# Patient Record
Sex: Female | Born: 1980 | Race: Black or African American | Hispanic: No | Smoking: Never smoker
Health system: Southern US, Community
[De-identification: ages and names within clinical notes are randomized; demographics above are authoritative.]

## PROBLEM LIST (undated history)

## (undated) ENCOUNTER — Inpatient Hospital Stay (HOSPITAL_COMMUNITY): Payer: Self-pay

## (undated) DIAGNOSIS — A749 Chlamydial infection, unspecified: Secondary | ICD-10-CM

## (undated) DIAGNOSIS — Z789 Other specified health status: Secondary | ICD-10-CM

## (undated) HISTORY — DX: Other specified health status: Z78.9

## (undated) HISTORY — PX: DILATION AND CURETTAGE OF UTERUS: SHX78

## (undated) HISTORY — PX: WISDOM TOOTH EXTRACTION: SHX21

---
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
## 2004-09-21 ENCOUNTER — Emergency Department (HOSPITAL_COMMUNITY): Admission: AD | Admit: 2004-09-21 | Discharge: 2004-09-21 | Payer: Self-pay | Admitting: Family Medicine

## 2005-05-27 ENCOUNTER — Emergency Department (HOSPITAL_COMMUNITY): Admission: EM | Admit: 2005-05-27 | Discharge: 2005-05-27 | Payer: Self-pay | Admitting: Emergency Medicine

## 2005-09-26 ENCOUNTER — Encounter: Admission: RE | Admit: 2005-09-26 | Discharge: 2005-09-26 | Payer: Self-pay | Admitting: Obstetrics and Gynecology

## 2007-02-20 ENCOUNTER — Encounter: Admission: RE | Admit: 2007-02-20 | Discharge: 2007-02-20 | Payer: Self-pay | Admitting: *Deleted

## 2007-03-20 ENCOUNTER — Emergency Department (HOSPITAL_COMMUNITY): Admission: EM | Admit: 2007-03-20 | Discharge: 2007-03-21 | Payer: Self-pay | Admitting: Emergency Medicine

## 2007-04-06 ENCOUNTER — Ambulatory Visit: Payer: Self-pay | Admitting: Family Medicine

## 2008-05-04 ENCOUNTER — Emergency Department (HOSPITAL_COMMUNITY): Admission: EM | Admit: 2008-05-04 | Discharge: 2008-05-04 | Payer: Self-pay | Admitting: Emergency Medicine

## 2009-01-17 ENCOUNTER — Ambulatory Visit: Payer: Self-pay | Admitting: Family Medicine

## 2009-01-19 ENCOUNTER — Ambulatory Visit: Payer: Self-pay | Admitting: Family Medicine

## 2009-01-24 ENCOUNTER — Ambulatory Visit: Payer: Self-pay | Admitting: Family Medicine

## 2009-04-22 IMAGING — CT CT ABDOMEN W/O CM
2 of 3 series · 17 of 46 positions shown, 19 images · non-contrast
Comparison: None.

ABDOMEN CT WITHOUT CONTRAST:

CLINICAL DATA: Low abdominal pain. Hematuria.
TECHNIQUE: Multidetector CT imaging of the abdomen and pelvis was performed
following the renal stone protocol without IV contrast.  Images were obtained
from the upper abdomen through the pubic symphysis.

[Series 2: renal stone w/o · axial · non-contrast · 0.54mm/px · z∈[-310,-25]mm · 14 of 67 slices shown, 16 images]
[im 5/67  soft-tissue]
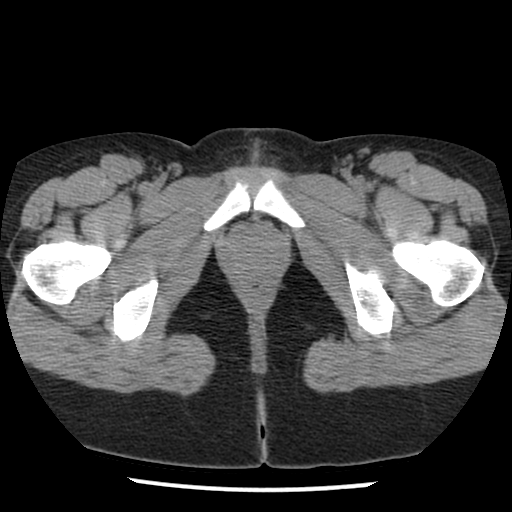
[im 5/67  bone]
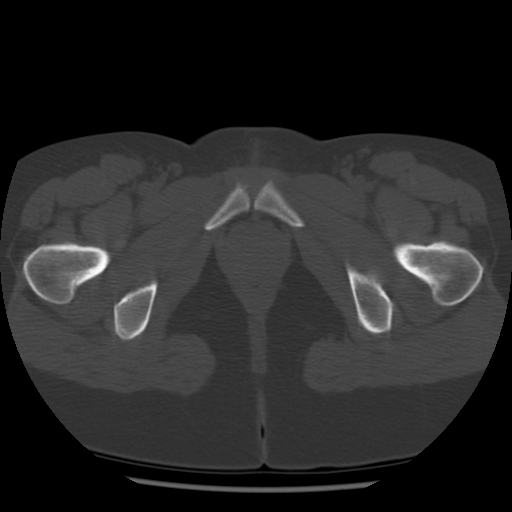
[im 9/67  soft-tissue]
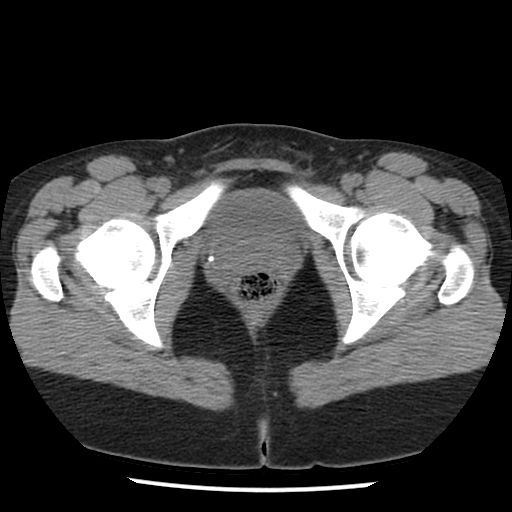
[im 13/67  soft-tissue]
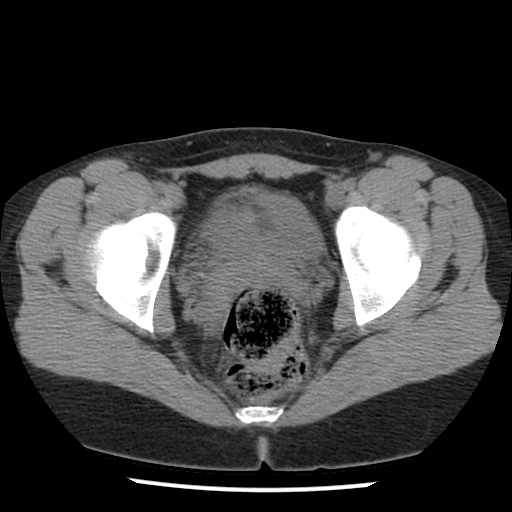
[im 18/67  soft-tissue]
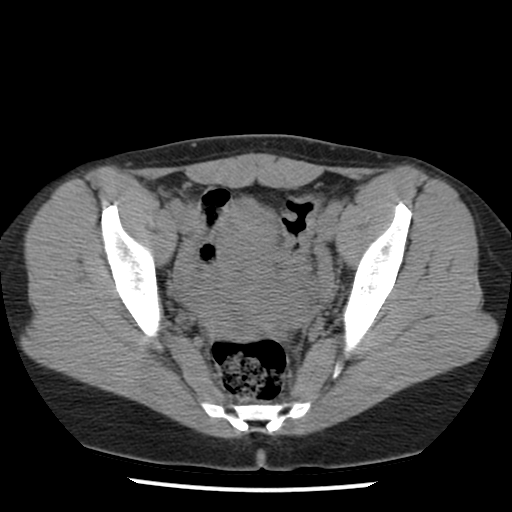
[im 22/67  soft-tissue]
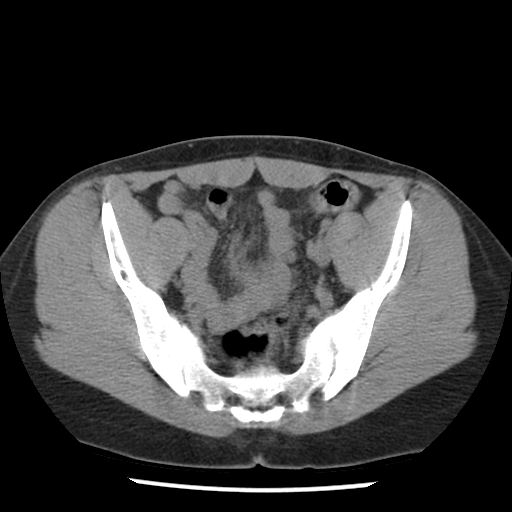
[im 26/67  soft-tissue]
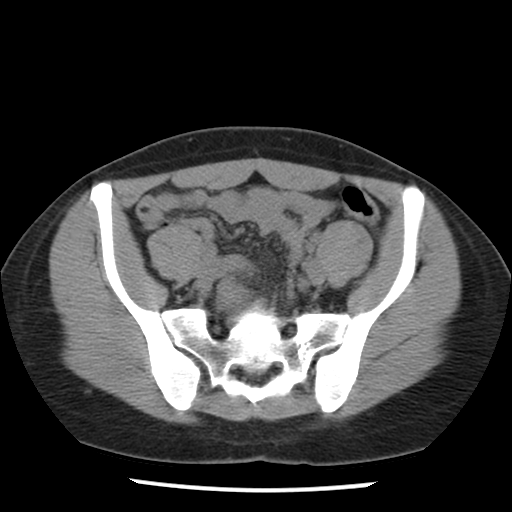
[im 30/67  soft-tissue]
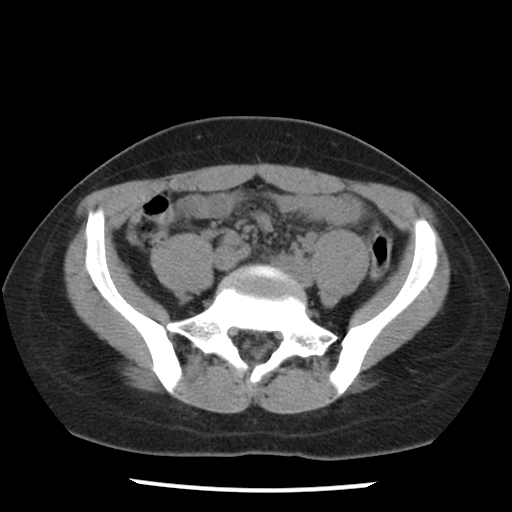
[im 37/67  soft-tissue]
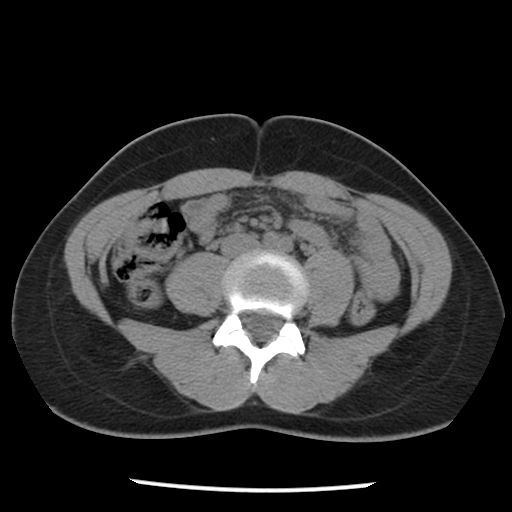
[im 41/67  soft-tissue]
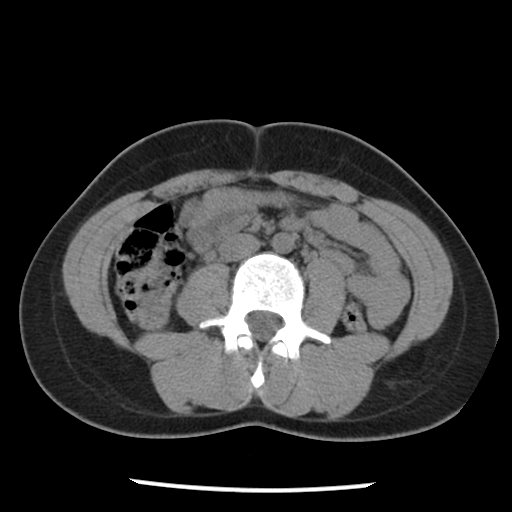
[im 41/67  bone]
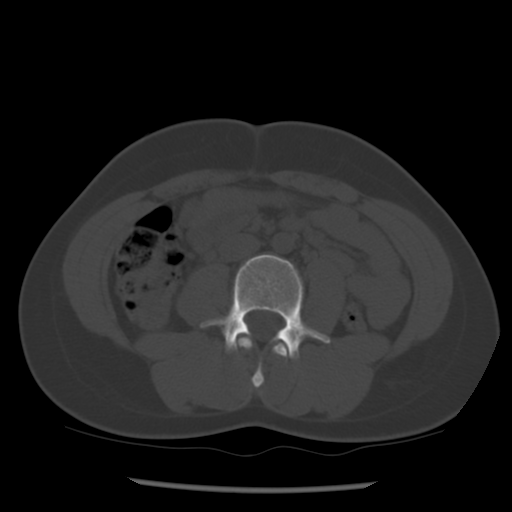
[im 45/67  soft-tissue]
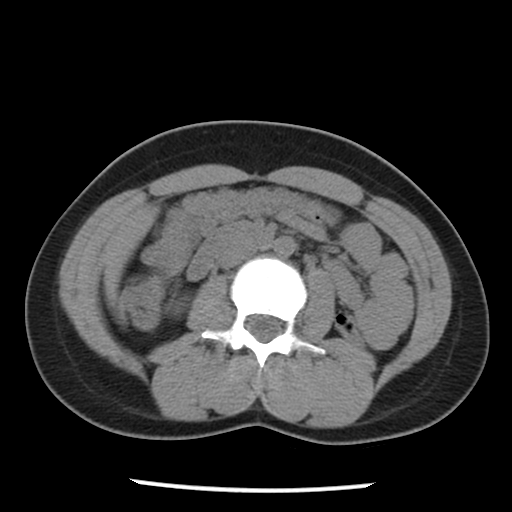
[im 49/67  soft-tissue]
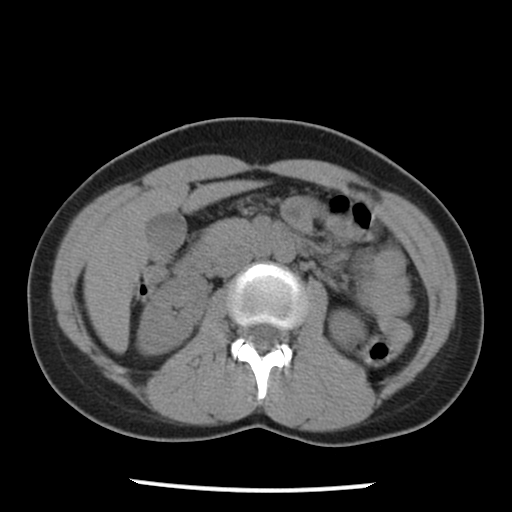
[im 54/67  soft-tissue]
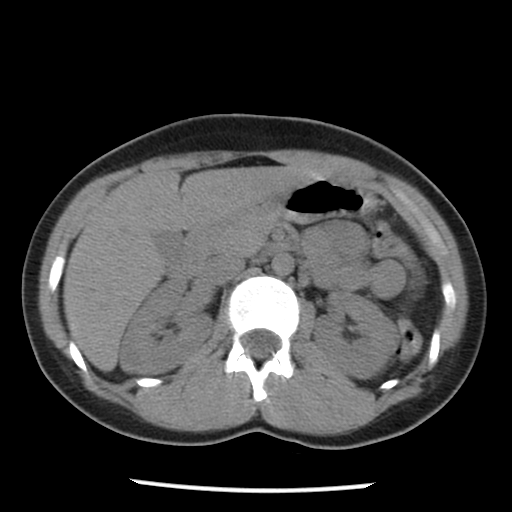
[im 58/67  soft-tissue]
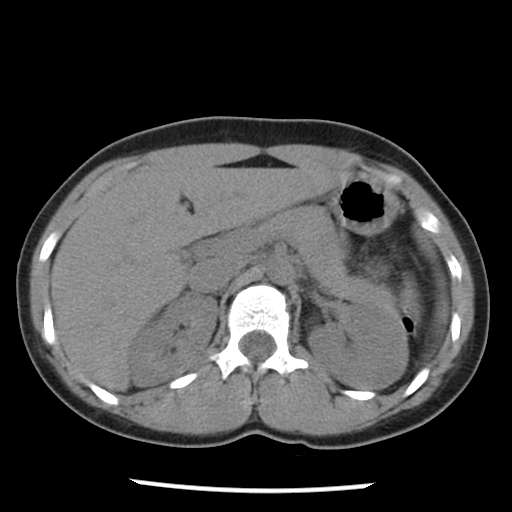
[im 62/67  soft-tissue]
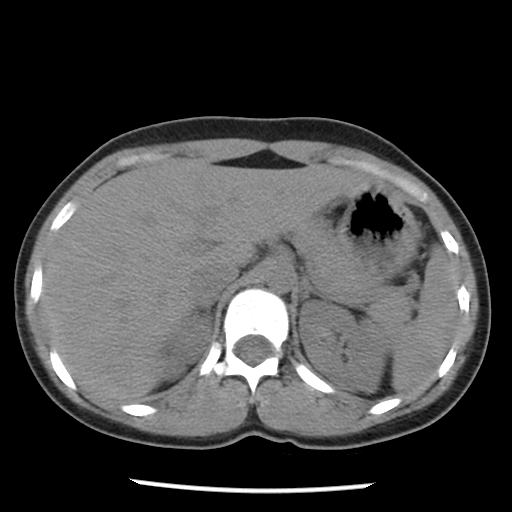

[Series 401: coronal · coronal · 0.70mm/px · 3 of 89 slices shown]
[im 30/89  soft-tissue]
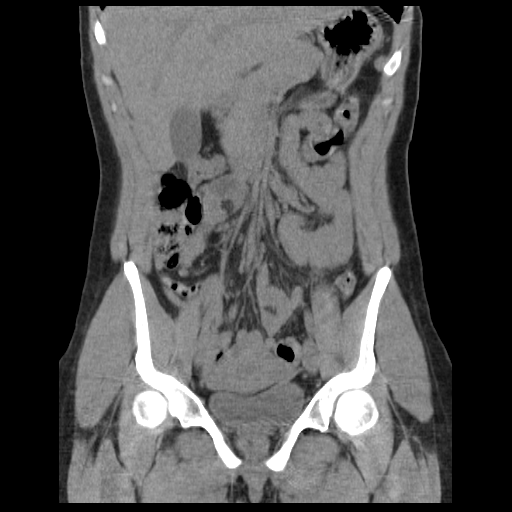
[im 40/89  soft-tissue]
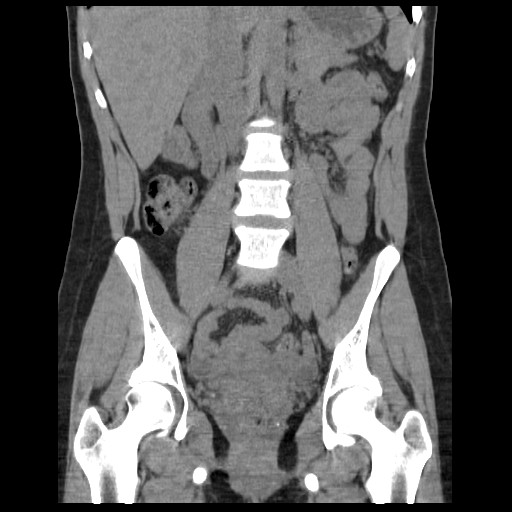
[im 49/89  soft-tissue]
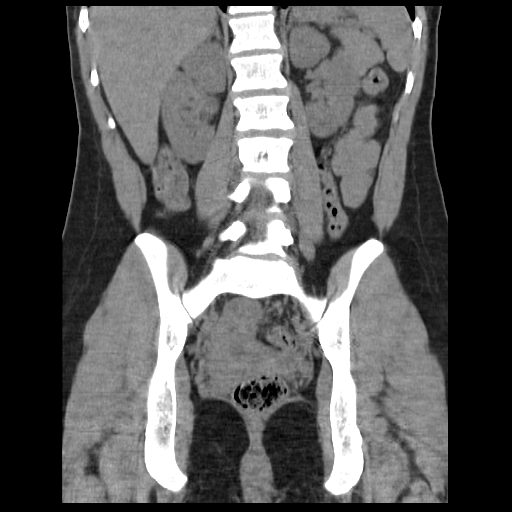

[17 of 46 positions shown; findings below may reference images not displayed]

FINDINGS: No focal abnormality is seen in the liver or spleen on this uninfused
exam. The stomach is decompressed. The duodenum, pancreas, gallbladder, and
adrenal glands are unremarkable.

There are no stones in either kidney. There is no hydronephrosis. No perinephric
edema or inflammation.

No intraperitoneal free fluid. Abdominal bowel loops are unremarkable. There is
no abdominal lymphadenopathy.
IMPRESSION: No renal stones.

No acute findings in the abdomen.

PELVIS CT WITHOUT CONTRAST:
FINDINGS: Multiple phleboliths are seen in the anatomic pelvis. There is no
evidence for ureteral or bladder stones.

There is a trace amount of intraperitoneal free fluid. No evidence for an
adnexal mass. The terminal ileum and the appendix are normal.
IMPRESSION: No distal urinary stones.

No acute findings in the anatomic pelvis.

## 2009-06-19 ENCOUNTER — Ambulatory Visit: Payer: Self-pay | Admitting: Family Medicine

## 2009-07-23 ENCOUNTER — Ambulatory Visit: Payer: Self-pay | Admitting: Family Medicine

## 2009-11-02 ENCOUNTER — Emergency Department (HOSPITAL_COMMUNITY): Admission: EM | Admit: 2009-11-02 | Discharge: 2009-11-02 | Payer: Self-pay | Admitting: Emergency Medicine

## 2009-11-17 ENCOUNTER — Encounter: Admission: RE | Admit: 2009-11-17 | Discharge: 2009-11-17 | Payer: Self-pay | Admitting: Obstetrics and Gynecology

## 2010-06-10 ENCOUNTER — Emergency Department (HOSPITAL_COMMUNITY): Admission: EM | Admit: 2010-06-10 | Discharge: 2010-06-10 | Payer: Self-pay | Admitting: Emergency Medicine

## 2010-10-08 LAB — CBC
Hemoglobin: 12.7 g/dL (ref 12.0–15.0)
MCHC: 34.3 g/dL (ref 30.0–36.0)
MCV: 87.4 fL (ref 78.0–100.0)
RBC: 4.24 MIL/uL (ref 3.87–5.11)

## 2010-10-08 LAB — POCT CARDIAC MARKERS
CKMB, poc: <1 ng/mL — ABNORMAL LOW (ref 1.0–8.0)
Troponin i, poc: <0.05 ng/mL (ref 0.00–0.09)

## 2010-10-08 LAB — BASIC METABOLIC PANEL
CO2: 26 mEq/L (ref 19–32)
Chloride: 105 mEq/L (ref 96–112)
GFR calc Af Amer: >60 mL/min (ref >60)
Sodium: 140 mEq/L (ref 135–145)

## 2010-10-08 LAB — DIFFERENTIAL
Basophils Relative: 1 % (ref 0–1)
Eosinophils Absolute: 0.2 10*3/uL (ref 0.0–0.7)
Monocytes Absolute: 0.3 10*3/uL (ref 0.1–1.0)
Monocytes Relative: 6 % (ref 3–12)
Neutro Abs: 3 10*3/uL (ref 1.7–7.7)

## 2011-05-02 LAB — COMPREHENSIVE METABOLIC PANEL
ALT: 30
AST: 25
Albumin: 4.3
Calcium: 9.3
Creatinine, Ser: 0.69
GFR calc Af Amer: >60
Sodium: 138
Total Protein: 7.9

## 2011-05-02 LAB — CBC
MCHC: 34.4
Platelets: 257
RBC: 4.66
RDW: 12.1

## 2011-05-02 LAB — DIFFERENTIAL
Eosinophils Absolute: 0
Eosinophils Relative: 0
Lymphocytes Relative: 2 — ABNORMAL LOW
Lymphs Abs: 0.2 — ABNORMAL LOW
Monocytes Relative: 0 — ABNORMAL LOW

## 2011-05-02 LAB — PREGNANCY, URINE: Preg Test, Ur: NEGATIVE

## 2011-05-02 LAB — URINALYSIS, ROUTINE W REFLEX MICROSCOPIC
Bilirubin Urine: NEGATIVE
Nitrite: NEGATIVE
Protein, ur: NEGATIVE
Specific Gravity, Urine: 1.023
Urobilinogen, UA: 0.2

## 2011-11-12 DIAGNOSIS — J302 Other seasonal allergic rhinitis: Secondary | ICD-10-CM | POA: Insufficient documentation

## 2012-01-29 ENCOUNTER — Other Ambulatory Visit: Payer: Self-pay | Admitting: Radiology

## 2012-06-11 ENCOUNTER — Inpatient Hospital Stay (HOSPITAL_COMMUNITY): Payer: Medicaid Other

## 2012-06-11 ENCOUNTER — Encounter (HOSPITAL_COMMUNITY): Payer: Self-pay | Admitting: *Deleted

## 2012-06-11 ENCOUNTER — Inpatient Hospital Stay (HOSPITAL_COMMUNITY)
Admission: AD | Admit: 2012-06-11 | Discharge: 2012-06-11 | Disposition: A | Discharge status: Home / Self Care | Payer: Medicaid Other | Source: Ambulatory Visit | Attending: Obstetrics | Admitting: Obstetrics

## 2012-06-11 DIAGNOSIS — Z3490 Encounter for supervision of normal pregnancy, unspecified, unspecified trimester: Secondary | ICD-10-CM

## 2012-06-11 DIAGNOSIS — R1032 Left lower quadrant pain: Secondary | ICD-10-CM | POA: Insufficient documentation

## 2012-06-11 DIAGNOSIS — O34599 Maternal care for other abnormalities of gravid uterus, unspecified trimester: Secondary | ICD-10-CM | POA: Insufficient documentation

## 2012-06-11 DIAGNOSIS — N831 Corpus luteum cyst of ovary, unspecified side: Secondary | ICD-10-CM | POA: Diagnosis present

## 2012-06-11 HISTORY — DX: Other specified health status: Z78.9

## 2012-06-11 LAB — WET PREP, GENITAL: Yeast Wet Prep HPF POC: NONE SEEN

## 2012-06-11 LAB — CBC
HCT: 37.9 % (ref 36.0–46.0)
Hemoglobin: 12.8 g/dL (ref 12.0–15.0)
Platelets: 272 10*3/uL (ref 150–400)
RBC: 4.41 MIL/uL (ref 3.87–5.11)
RDW: 12.2 % (ref 11.5–15.5)

## 2012-06-11 LAB — URINALYSIS, ROUTINE W REFLEX MICROSCOPIC
Glucose, UA: NEGATIVE mg/dL
Hgb urine dipstick: NEGATIVE
Ketones, ur: NEGATIVE mg/dL
Protein, ur: NEGATIVE mg/dL
pH: 6.5 (ref 5.0–8.0)

## 2012-06-11 LAB — POCT PREGNANCY, URINE: Preg Test, Ur: POSITIVE — AB

## 2012-06-11 NOTE — MAU Note (Signed)
Pt discharged by error  ,

## 2012-06-11 NOTE — MAU Note (Signed)
Patient states she had a positive pregnancy test at [facility] on 11-13. Has first appointment with Dr. Gaynell Face on 12-2. State she started having sharp pain on the left side at 0800. Has nausea with vomiting iin the am only. Denies any bleeding or vaginal discharge.

## 2012-06-11 NOTE — MAU Note (Signed)
+  HPT & pos test @ [facility] on 10/12, started having LLQ pain today.  Pt denies bleeding.

## 2012-06-11 NOTE — MAU Provider Note (Signed)
Chief Complaint: Abdominal Pain   First Provider Initiated Contact with Patient 06/11/12 1636     SUBJECTIVE HPI: Gina Hayden is a 31 y.o. G2P0010 at [redacted]w[redacted]d by LMP who presents to maternity admissions reporting LLQ pain described as sharp, intermittent, and "where my ovaries are".  She had a positive pregnancy test at home and at [facility] and has had one visit for paperwork with Dr Gaynell Face.  She denies vaginal bleeding, vaginal itching/burning, urinary symptoms, h/a, dizziness, n/v, or fever/chills.     Past Medical History  Diagnosis Date  . No pertinent past medical history    Past Surgical History  Procedure Date  . Wisdom tooth extraction    History   Social History  . Marital Status: Single    Spouse Name: N/A    Number of Children: N/A  . Years of Education: N/A   Occupational History  . Not on file.   Social History Main Topics  . Smoking status: Never Smoker   . Smokeless tobacco: Not on file  . Alcohol Use: Yes     Comment: social alcohol before pregnancy  . Drug Use: No  . Sexually Active: Yes   Other Topics Concern  . Not on file   Social History Narrative  . No narrative on file   No current facility-administered medications on file prior to encounter.   No current outpatient prescriptions on file prior to encounter.   Allergies not on file  ROS: Pertinent items in HPI  OBJECTIVE Blood pressure 123/77, pulse 98, temperature 98.9 F (37.2 C), temperature source Oral, resp. rate 16, height 5\' 3"  (1.6 m), weight 56.155 kg (123 lb 12.8 oz), last menstrual period 04/30/2012, SpO2 100.00%. GENERAL: Well-developed, well-nourished female in no acute distress.  HEENT: Normocephalic HEART: normal rate RESP: normal effort ABDOMEN: Soft, non-tender EXTREMITIES: Nontender, no edema NEURO: Alert and oriented Pelvic exam: Cervix pink, visually closed, without lesion, scant white creamy discharge, vaginal walls and external genitalia normal Bimanual  exam: Cervix 0/long/high, firm, anterior, neg CMT, uterus nontender, nonenlarged, adnexa with mild tenderness on left, none on right, no enlargement or mass bilaterally   LAB RESULTS Results for orders placed during the hospital encounter of 06/11/12 (from the past 24 hour(s))  URINALYSIS, ROUTINE W REFLEX MICROSCOPIC     Status: Normal   Collection Time   06/11/12  1:00 PM      Component Value Range   Color, Urine YELLOW  YELLOW   APPearance CLEAR  CLEAR   Specific Gravity, Urine 1.020  1.005 - 1.030   pH 6.5  5.0 - 8.0   Glucose, UA NEGATIVE  NEGATIVE mg/dL   Hgb urine dipstick NEGATIVE  NEGATIVE   Bilirubin Urine NEGATIVE  NEGATIVE   Ketones, ur NEGATIVE  NEGATIVE mg/dL   Protein, ur NEGATIVE  NEGATIVE mg/dL   Urobilinogen, UA 0.2  0.0 - 1.0 mg/dL   Nitrite NEGATIVE  NEGATIVE   Leukocytes, UA NEGATIVE  NEGATIVE  POCT PREGNANCY, URINE     Status: Abnormal   Collection Time   06/11/12  3:11 PM      Component Value Range   Preg Test, Ur POSITIVE (*) NEGATIVE  CBC     Status: Normal   Collection Time   06/11/12  4:23 PM      Component Value Range   WBC 9.0  4.0 - 10.5 K/uL   RBC 4.41  3.87 - 5.11 MIL/uL   Hemoglobin 12.8  12.0 - 15.0 g/dL   HCT 37.9  36.0 - 46.0 %   MCV 85.9  78.0 - 100.0 fL   MCH 29.0  26.0 - 34.0 pg   MCHC 33.8  30.0 - 36.0 g/dL   RDW 40.9  81.1 - 91.4 %   Platelets 272  150 - 400 K/uL  ABO/RH     Status: Normal (Preliminary result)   Collection Time   06/11/12  4:23 PM      Component Value Range   ABO/RH(D) A POS    WET PREP, GENITAL     Status: Abnormal   Collection Time   06/11/12  4:35 PM      Component Value Range   Yeast Wet Prep HPF POC NONE SEEN  NONE SEEN   Trich, Wet Prep NONE SEEN  NONE SEEN   Clue Cells Wet Prep HPF POC FEW (*) NONE SEEN   WBC, Wet Prep HPF POC FEW (*) NONE SEEN    IMAGING   ASSESSMENT 1. Normal IUP (intrauterine pregnancy) on prenatal ultrasound   2. Corpus luteum cyst     PLAN Discharge home Reviewed  normal U/S findings with pt F/U with Dr Gaynell Face Return to MAU as needed     Medication List    Notice       You have not been prescribed any medications.            Follow-up Information    Follow up with Kathreen Cosier, MD.   Contact information:   [address] [place] [address] [phone]          Sharen Counter Certified Nurse-Midwife 06/11/2012  5:25 PM

## 2012-07-06 LAB — OB RESULTS CONSOLE ABO/RH

## 2012-07-06 LAB — OB RESULTS CONSOLE HEPATITIS B SURFACE ANTIGEN: Hepatitis B Surface Ag: NEGATIVE

## 2012-07-06 LAB — OB RESULTS CONSOLE ANTIBODY SCREEN: Antibody Screen: NEGATIVE

## 2012-07-06 LAB — OB RESULTS CONSOLE RUBELLA ANTIBODY, IGM: Rubella: IMMUNE

## 2012-07-21 NOTE — L&D Delivery Note (Signed)
Delivery Note At 11:03 PM a viable female was delivered via  (Presentation: direct OP ).  Nuchal cord x 1.   Placenta status: delivered w/cord traction, intact, 3 vessels.  Membranes malodorous.  Cord pH: 7.13  Decreased, no cry initially.  Heart rate  100 bpm.  Neonatology called. Infant resuscitated--PPV given by bag/mask.  Infant w/undescended testes per Neonatology.  Anesthesia: Epidural  Episiotomy: None Lacerations: 2nd degree Suture Repair: 2.0 3.0 vicryl rapide Est. Blood Loss (mL): 250 ml  Mom to postpartum.  Baby to nursery-stable.  JACKSON-MOORE,Jodelle Fausto A 01/16/2013, 11:27 PM

## 2012-08-21 ENCOUNTER — Encounter (HOSPITAL_COMMUNITY): Payer: Self-pay | Admitting: Obstetrics and Gynecology

## 2012-08-21 ENCOUNTER — Inpatient Hospital Stay (HOSPITAL_COMMUNITY)
Admission: AD | Admit: 2012-08-21 | Discharge: 2012-08-21 | Disposition: A | Discharge status: Home / Self Care | Payer: Medicaid Other | Source: Ambulatory Visit | Attending: Obstetrics | Admitting: Obstetrics

## 2012-08-21 DIAGNOSIS — M545 Low back pain, unspecified: Secondary | ICD-10-CM | POA: Insufficient documentation

## 2012-08-21 DIAGNOSIS — R109 Unspecified abdominal pain: Secondary | ICD-10-CM | POA: Insufficient documentation

## 2012-08-21 DIAGNOSIS — N949 Unspecified condition associated with female genital organs and menstrual cycle: Secondary | ICD-10-CM

## 2012-08-21 DIAGNOSIS — O99891 Other specified diseases and conditions complicating pregnancy: Secondary | ICD-10-CM | POA: Insufficient documentation

## 2012-08-21 DIAGNOSIS — R300 Dysuria: Secondary | ICD-10-CM | POA: Insufficient documentation

## 2012-08-21 LAB — URINE MICROSCOPIC-ADD ON

## 2012-08-21 LAB — URINALYSIS, ROUTINE W REFLEX MICROSCOPIC
Glucose, UA: NEGATIVE mg/dL
Ketones, ur: 15 mg/dL — AB
Protein, ur: NEGATIVE mg/dL

## 2012-08-21 LAB — WET PREP, GENITAL: Trich, Wet Prep: NONE SEEN

## 2012-08-21 NOTE — MAU Provider Note (Signed)
History     CSN: [id number]  Arrival date and time: 08/21/12 1520   First Provider Initiated Contact with Patient 08/21/12 [phone]      Chief Complaint  Patient presents with  . Back Pain  . Abdominal Pain  . Dysuria   HPI Gina Hayden is a 32 y.o. G2P0010 at [redacted]w[redacted]d who presents to MAU today complaining of lower abdominal pressure and low back pain. The patient has also had dysuria. She states that these symptoms have been going on for about 2 weeks. She denies vaginal bleeding, abnormal discharge and LOF, N/V or fever.   OB History    Grav Para Term Preterm Abortions TAB SAB Ect Mult Living   2    1 1           Past Medical History  Diagnosis Date  . No pertinent past medical history     Past Surgical History  Procedure Date  . Wisdom tooth extraction     Family History  Problem Relation Age of Onset  . Hypertension Mother   . Hyperlipidemia Mother   . Hypertension Sister     History  Substance Use Topics  . Smoking status: Never Smoker   . Smokeless tobacco: Not on file  . Alcohol Use: Yes     Comment: social alcohol before pregnancy    Allergies: No Known Allergies  Prescriptions prior to admission  Medication Sig Dispense Refill  . Prenatal Vit-Fe Fumarate-FA (PRENATAL MULTIVITAMIN) TABS Take 1 tablet by mouth daily.        ROS All negative unless otherwise noted in HPI Physical Exam   Blood pressure 120/78, pulse 99, temperature 98.3 F (36.8 C), resp. rate 16, height 5\' 2"  (1.575 m), weight 128 lb (58.06 kg), last menstrual period 04/30/2012.  Physical Exam  Constitutional: She is oriented to person, place, and time. She appears well-developed and well-nourished. No distress.  HENT:  Head: Normocephalic and atraumatic.  Cardiovascular: Normal rate, regular rhythm and normal heart sounds.   Respiratory: Effort normal and breath sounds normal. No respiratory distress.  GI: Soft. Bowel sounds are normal. She exhibits no distension and no mass.  There is tenderness (mild tenderness to palpation of the lower abdomen around the midline). There is no rebound and no guarding.  Genitourinary: Vagina normal. Cervix exhibits discharge (moderate amount of thick, white discharge noted at the cervix and in the vaginal vault). Cervix exhibits no motion tenderness and no friability. Right adnexum displays no mass and no tenderness. Left adnexum displays no mass and no tenderness.       Cervix closed, long and thick  Neurological: She is alert and oriented to person, place, and time.  Skin: Skin is warm and dry. No erythema.  Psychiatric: She has a normal mood and affect.   Results for orders placed during the hospital encounter of 08/21/12 (from the past 24 hour(s))  URINALYSIS, ROUTINE W REFLEX MICROSCOPIC     Status: Abnormal   Collection Time   08/21/12  3:30 PM      Component Value Range   Color, Urine YELLOW  YELLOW   APPearance CLEAR  CLEAR   Specific Gravity, Urine 1.025  1.005 - 1.030   pH 6.0  5.0 - 8.0   Glucose, UA NEGATIVE  NEGATIVE mg/dL   Hgb urine dipstick TRACE (*) NEGATIVE   Bilirubin Urine NEGATIVE  NEGATIVE   Ketones, ur 15 (*) NEGATIVE mg/dL   Protein, ur NEGATIVE  NEGATIVE mg/dL   Urobilinogen, UA 0.2  0.0 - 1.0 mg/dL   Nitrite NEGATIVE  NEGATIVE   Leukocytes, UA NEGATIVE  NEGATIVE  URINE MICROSCOPIC-ADD ON     Status: Abnormal   Collection Time   08/21/12  3:30 PM      Component Value Range   Squamous Epithelial / LPF FEW (*) RARE   WBC, UA 3-6  <3 WBC/hpf   RBC / HPF 0-2  <3 RBC/hpf   Bacteria, UA MANY (*) RARE  WET PREP, GENITAL     Status: Abnormal   Collection Time   08/21/12  4:00 PM      Component Value Range   Yeast Wet Prep HPF POC NONE SEEN  NONE SEEN   Trich, Wet Prep NONE SEEN  NONE SEEN   Clue Cells Wet Prep HPF POC NONE SEEN  NONE SEEN   WBC, Wet Prep HPF POC MODERATE (*) NONE SEEN    MAU Course  Procedures None  MDM Discussed patient with Dr. Clearance Coots. He feels that this is most likely round  ligament pain as we have ruled out any other reason for her discomfort. She can take tylenol as needed for pain.   Assessment and Plan  A: Round ligament pain  P: Discharge home Tylenol as needed for discomfort Keep scheduled follow-up with Dr. Gaynell Face Return to MAU as needed or if condition should change or worsen  Freddi Starr, PA-C 08/21/2012, 4:35 PM

## 2012-08-21 NOTE — MAU Note (Signed)
"  I have had lower abd pain and burning and pain with urination.  Its' been going on about 1-2 weeks.  At first I thought it was a yeast infection, so I used Monistat.  It is still going on after taking that.  I talked to the doctor and he said it might be a bladder infection.  No VB or abnormal vag d/c."

## 2012-08-21 NOTE — MAU Note (Signed)
Pt presents with complaints of abdominal pain, back pain and pain with urination. States she thought she had a yeast infection but the pain is getting worse and has been going on about 2 weeks.

## 2012-08-22 LAB — GC/CHLAMYDIA PROBE AMP
CT Probe RNA: NEGATIVE
GC Probe RNA: NEGATIVE

## 2012-08-22 LAB — URINE CULTURE: Colony Count: 85000

## 2012-10-19 ENCOUNTER — Inpatient Hospital Stay (HOSPITAL_COMMUNITY)
Admission: AD | Admit: 2012-10-19 | Discharge: 2012-10-19 | Disposition: A | Discharge status: Home / Self Care | Payer: Medicaid Other | Source: Ambulatory Visit | Attending: Obstetrics | Admitting: Obstetrics

## 2012-10-19 ENCOUNTER — Encounter (HOSPITAL_COMMUNITY): Payer: Self-pay

## 2012-10-19 DIAGNOSIS — R109 Unspecified abdominal pain: Secondary | ICD-10-CM | POA: Insufficient documentation

## 2012-10-19 DIAGNOSIS — M545 Low back pain, unspecified: Secondary | ICD-10-CM | POA: Insufficient documentation

## 2012-10-19 DIAGNOSIS — N949 Unspecified condition associated with female genital organs and menstrual cycle: Secondary | ICD-10-CM

## 2012-10-19 DIAGNOSIS — O99891 Other specified diseases and conditions complicating pregnancy: Secondary | ICD-10-CM | POA: Insufficient documentation

## 2012-10-19 LAB — URINALYSIS, ROUTINE W REFLEX MICROSCOPIC
Bilirubin Urine: NEGATIVE
Nitrite: NEGATIVE
Protein, ur: NEGATIVE mg/dL
Specific Gravity, Urine: 1.01 (ref 1.005–1.030)
Urobilinogen, UA: 0.2 mg/dL (ref 0.0–1.0)

## 2012-10-19 MED ORDER — ACETAMINOPHEN 500 MG PO TABS
1000.0000 mg | ORAL_TABLET | Freq: Once | ORAL | Status: AC
  Administered 2012-10-19: 1000 mg via ORAL
  Filled 2012-10-19: qty 2

## 2012-10-19 MED ORDER — ABDOMINAL BINDER/ELASTIC MED MISC: 1.0000 [IU] | Freq: Every day | Status: DC | PRN

## 2012-10-19 MED ORDER — CYCLOBENZAPRINE HCL 10 MG PO TABS: 10.0000 mg | ORAL_TABLET | Freq: Two times a day (BID) | ORAL | Status: DC | PRN

## 2012-10-19 NOTE — MAU Provider Note (Signed)
History     CSN: [id number]  Arrival date and time: 10/19/12 1105   First Provider Initiated Contact with Patient 10/19/12 1146      No chief complaint on file.  HPI [name] is a 32 y.o. G2P0010 at [redacted]w[redacted]d who presents to MAU today with complaint of mid abdominal pain bilaterally and low back pain. The patient describes the pain as sharp and mild cramping. This started around midnight. She has not taken anything for the pain. She denies N/V, fever, vaginal discharge, vaginal bleeding or LOF. She denies UTI symptoms or contractions. She reports good fetal movement. Her next appointment with Dr. Gaynell Face is on 11/03/12.    OB History   Grav Para Term Preterm Abortions TAB SAB Ect Mult Living   2    1 1           Past Medical History  Diagnosis Date  . No pertinent past medical history     Past Surgical History  Procedure Laterality Date  . Wisdom tooth extraction    . Dilation and curettage of uterus      abortion    Family History  Problem Relation Age of Onset  . Hypertension Mother   . Hyperlipidemia Mother   . Hypertension Sister     History  Substance Use Topics  . Smoking status: Never Smoker   . Smokeless tobacco: Not on file  . Alcohol Use: Yes     Comment: social alcohol before pregnancy    Allergies: No Known Allergies  No prescriptions prior to admission    Review of Systems  Constitutional: Negative for fever and malaise/fatigue.  Gastrointestinal: Positive for abdominal pain. Negative for nausea, vomiting, diarrhea and constipation.  Genitourinary: Negative for dysuria, urgency and frequency.       Neg - vaginal bleeding, discharge, LOF  Musculoskeletal: Positive for back pain.   Physical Exam   Blood pressure 121/65, pulse 105, temperature 98.8 F (37.1 C), temperature source Oral, resp. rate 18, height 5\' 1"  (1.549 m), weight 141 lb 8 oz (64.184 kg), last menstrual period 04/30/2012, SpO2 98.00%.  Physical Exam  Constitutional: She  is oriented to person, place, and time. She appears well-developed and well-nourished. No distress.  HENT:  Head: Normocephalic and atraumatic.  Cardiovascular: Normal rate, regular rhythm and normal heart sounds.   Respiratory: Effort normal and breath sounds normal. No respiratory distress.  GI: Soft. Bowel sounds are normal. She exhibits no distension and no mass. There is no tenderness (no tenderness to palpation). There is no rebound and no guarding.  Musculoskeletal: She exhibits no edema and no tenderness (no tenderness to palpation of the lower back).  Neurological: She is alert and oriented to person, place, and time.  Skin: Skin is warm and dry. No erythema.   Results for orders placed during the hospital encounter of 10/19/12 (from the past 24 hour(s))  URINALYSIS, ROUTINE W REFLEX MICROSCOPIC     Status: None   Collection Time    10/19/12 11:19 AM      Result Value Range   Color, Urine YELLOW  YELLOW   APPearance CLEAR  CLEAR   Specific Gravity, Urine 1.010  1.005 - 1.030   pH 7.0  5.0 - 8.0   Glucose, UA NEGATIVE  NEGATIVE mg/dL   Hgb urine dipstick NEGATIVE  NEGATIVE   Bilirubin Urine NEGATIVE  NEGATIVE   Ketones, ur NEGATIVE  NEGATIVE mg/dL   Protein, ur NEGATIVE  NEGATIVE mg/dL   Urobilinogen, UA 0.2  0.0 - 1.0 mg/dL   Nitrite NEGATIVE  NEGATIVE   Leukocytes, UA NEGATIVE  NEGATIVE   Fetal Monitoring: Baseline: 130 bpm, moderate variability, + accelerations, no decelerations Contractions: none  MAU Course  Procedures None  MDM Tylenol in MAU - pain improved from 7/10 - 3/10  Assessment and Plan  A: Round ligament pain Back pain in pregnancy  P: Discharge home Rx for Flexeril and abdominal binder sent to patient's pharmacy Tyelnol PRN for pain Keep scheduled follow-up with Dr. Gaynell Face Return to MAU as needed or if her condition should change or worsen  Freddi Starr, PA-C  10/19/2012, 3:48 PM

## 2012-10-19 NOTE — MAU Note (Signed)
Patient is in with c/o lower back and abdominal pain that started last night. She denies vaginal bleeding, LOF or abnormal vaginal discharge. She reports good fetal movement. She states that she have an appt on 04/16.

## 2013-01-15 ENCOUNTER — Encounter (HOSPITAL_COMMUNITY): Payer: Self-pay | Admitting: *Deleted

## 2013-01-15 ENCOUNTER — Inpatient Hospital Stay (HOSPITAL_COMMUNITY): Payer: Medicaid Other | Admitting: Anesthesiology

## 2013-01-15 ENCOUNTER — Inpatient Hospital Stay (HOSPITAL_COMMUNITY)
Admission: AD | Admit: 2013-01-15 | Discharge: 2013-01-18 | DRG: 775 | Disposition: A | Discharge status: Home / Self Care | Payer: Medicaid Other | Source: Ambulatory Visit | Attending: Obstetrics | Admitting: Obstetrics

## 2013-01-15 ENCOUNTER — Encounter (HOSPITAL_COMMUNITY): Payer: Self-pay | Admitting: Anesthesiology

## 2013-01-15 DIAGNOSIS — O4290 Premature rupture of membranes, unspecified as to length of time between rupture and onset of labor, unspecified weeks of gestation: Secondary | ICD-10-CM | POA: Diagnosis present

## 2013-01-15 LAB — CBC
Hemoglobin: 12.4 g/dL (ref 12.0–15.0)
MCHC: 33.7 g/dL (ref 30.0–36.0)
RDW: 13.1 % (ref 11.5–15.5)
WBC: 8.6 10*3/uL (ref 4.0–10.5)

## 2013-01-15 LAB — GROUP B STREP BY PCR: Group B strep by PCR: NEGATIVE

## 2013-01-15 MED ORDER — TERBUTALINE SULFATE 1 MG/ML IJ SOLN: 0.2500 mg | Freq: Once | INTRAMUSCULAR | Status: AC | PRN

## 2013-01-15 MED ORDER — LIDOCAINE HCL (PF) 1 % IJ SOLN
30.0000 mL | INTRAMUSCULAR | Status: DC | PRN
  Administered 2013-01-16: 30 mL via SUBCUTANEOUS
  Filled 2013-01-15 (×3): qty 30

## 2013-01-15 MED ORDER — FENTANYL 2.5 MCG/ML BUPIVACAINE 1/10 % EPIDURAL INFUSION (WH - ANES)
14.0000 mL/h | INTRAMUSCULAR | Status: DC | PRN
  Administered 2013-01-16 (×2): 14 mL/h via EPIDURAL
  Filled 2013-01-15 (×3): qty 125

## 2013-01-15 MED ORDER — LACTATED RINGERS IV SOLN
500.0000 mL | INTRAVENOUS | Status: DC | PRN
  Administered 2013-01-16: 1000 mL via INTRAVENOUS
  Administered 2013-01-16: 500 mL via INTRAVENOUS

## 2013-01-15 MED ORDER — LACTATED RINGERS IV SOLN
500.0000 mL | Freq: Once | INTRAVENOUS | Status: AC
  Administered 2013-01-15: 500 mL via INTRAVENOUS

## 2013-01-15 MED ORDER — CITRIC ACID-SODIUM CITRATE 334-500 MG/5ML PO SOLN: 30.0000 mL | ORAL | Status: DC | PRN

## 2013-01-15 MED ORDER — LACTATED RINGERS IV SOLN
INTRAVENOUS | Status: DC
  Administered 2013-01-15 – 2013-01-16 (×2): via INTRAVENOUS
  Administered 2013-01-16: 500 mL via INTRAVENOUS
  Administered 2013-01-16: 07:00:00 via INTRAVENOUS
  Administered 2013-01-16: 125 mL/h via INTRAVENOUS

## 2013-01-15 MED ORDER — OXYTOCIN 40 UNITS IN LACTATED RINGERS INFUSION - SIMPLE MED: 62.5000 mL/h | INTRAVENOUS | Status: DC

## 2013-01-15 MED ORDER — LIDOCAINE HCL (PF) 1 % IJ SOLN
INTRAMUSCULAR | Status: DC | PRN
  Administered 2013-01-15 (×2): 3 mL

## 2013-01-15 MED ORDER — PHENYLEPHRINE 40 MCG/ML (10ML) SYRINGE FOR IV PUSH (FOR BLOOD PRESSURE SUPPORT)
80.0000 ug | PREFILLED_SYRINGE | INTRAVENOUS | Status: DC | PRN
  Filled 2013-01-15: qty 2
  Filled 2013-01-15: qty 5

## 2013-01-15 MED ORDER — PHENYLEPHRINE 40 MCG/ML (10ML) SYRINGE FOR IV PUSH (FOR BLOOD PRESSURE SUPPORT)
80.0000 ug | PREFILLED_SYRINGE | INTRAVENOUS | Status: DC | PRN
  Filled 2013-01-15: qty 2

## 2013-01-15 MED ORDER — ACETAMINOPHEN 325 MG PO TABS: 650.0000 mg | ORAL_TABLET | ORAL | Status: DC | PRN

## 2013-01-15 MED ORDER — EPHEDRINE 5 MG/ML INJ
10.0000 mg | INTRAVENOUS | Status: DC | PRN
  Filled 2013-01-15: qty 4
  Filled 2013-01-15: qty 2

## 2013-01-15 MED ORDER — IBUPROFEN 600 MG PO TABS
600.0000 mg | ORAL_TABLET | Freq: Four times a day (QID) | ORAL | Status: DC | PRN
  Administered 2013-01-16: 600 mg via ORAL
  Filled 2013-01-15: qty 1

## 2013-01-15 MED ORDER — FENTANYL 2.5 MCG/ML BUPIVACAINE 1/10 % EPIDURAL INFUSION (WH - ANES)
INTRAMUSCULAR | Status: DC | PRN
  Administered 2013-01-15: 12 mL/h via EPIDURAL

## 2013-01-15 MED ORDER — OXYTOCIN 40 UNITS IN LACTATED RINGERS INFUSION - SIMPLE MED
1.0000 m[IU]/min | INTRAVENOUS | Status: DC
  Administered 2013-01-15: 2 m[IU]/min via INTRAVENOUS
  Administered 2013-01-16: 6 m[IU]/min via INTRAVENOUS
  Administered 2013-01-16: 3 m[IU]/min via INTRAVENOUS
  Administered 2013-01-16: 1 m[IU]/min via INTRAVENOUS
  Administered 2013-01-16: 5 m[IU]/min via INTRAVENOUS
  Administered 2013-01-16: 7 m[IU]/min via INTRAVENOUS
  Administered 2013-01-16: 4 m[IU]/min via INTRAVENOUS
  Administered 2013-01-16: 2 m[IU]/min via INTRAVENOUS
  Filled 2013-01-15: qty 1000

## 2013-01-15 MED ORDER — FLEET ENEMA 7-19 GM/118ML RE ENEM: 1.0000 | ENEMA | RECTAL | Status: DC | PRN

## 2013-01-15 MED ORDER — OXYCODONE-ACETAMINOPHEN 5-325 MG PO TABS: 1.0000 | ORAL_TABLET | ORAL | Status: DC | PRN

## 2013-01-15 MED ORDER — EPHEDRINE 5 MG/ML INJ
10.0000 mg | INTRAVENOUS | Status: DC | PRN
  Filled 2013-01-15: qty 2

## 2013-01-15 MED ORDER — ONDANSETRON HCL 4 MG/2ML IJ SOLN
4.0000 mg | Freq: Four times a day (QID) | INTRAMUSCULAR | Status: DC | PRN
  Administered 2013-01-16: 4 mg via INTRAVENOUS
  Filled 2013-01-15: qty 2

## 2013-01-15 MED ORDER — DIPHENHYDRAMINE HCL 50 MG/ML IJ SOLN: 12.5000 mg | INTRAMUSCULAR | Status: DC | PRN

## 2013-01-15 MED ORDER — OXYTOCIN BOLUS FROM INFUSION: 500.0000 mL | INTRAVENOUS | Status: DC

## 2013-01-15 NOTE — MAU Note (Signed)
PT  SAYS   SROM AT 635PM-   CLEAR FLUID.    AT OFFICE- VE - CLOSED.    DENIES HSV ND MRSA.    NO UC'S.

## 2013-01-15 NOTE — MAU Note (Signed)
Patient presents to MAU with c/o PROM clear fluid at 1830. Reports +FM; denies vaginal bleeding or pain.

## 2013-01-15 NOTE — Anesthesia Procedure Notes (Signed)
Epidural Patient location during procedure: OB Start time: 01/15/2013 10:25 AM End time: 01/15/2013 10:40 AM  Staffing Anesthesiologist: Lewie Loron R Performed by: anesthesiologist   Preanesthetic Checklist Completed: patient identified, pre-op evaluation, timeout performed, IV checked, risks and benefits discussed and monitors and equipment checked  Epidural Patient position: sitting Prep: site prepped and draped and DuraPrep Patient monitoring: heart rate, continuous pulse ox and blood pressure Approach: midline Injection technique: LOR air and LOR saline  Needle:  Needle type: Tuohy  Needle gauge: 17 G Needle length: 9 cm Needle insertion depth: 5 cm Catheter type: closed end flexible Catheter size: 19 Gauge Catheter at skin depth: 11 cm  Assessment Sensory level: T8 Events: blood not aspirated, injection not painful, no injection resistance, negative IV test and no paresthesia  Additional Notes Reason for block:procedure for pain

## 2013-01-15 NOTE — Anesthesia Preprocedure Evaluation (Signed)
Anesthesia Evaluation  Patient identified by MRN, date of birth, ID band Patient awake    Reviewed: Allergy & Precautions, H&P , NPO status , Patient's Chart, lab work & pertinent test results  Airway Mallampati: II TM Distance: >3 FB     Dental  (+) Dental Advisory Given and Teeth Intact   Pulmonary neg pulmonary ROS,  breath sounds clear to auscultation        Cardiovascular negative cardio ROS  Rhythm:Regular Rate:Normal     Neuro/Psych negative neurological ROS  negative psych ROS   GI/Hepatic negative GI ROS, Neg liver ROS,   Endo/Other  negative endocrine ROS  Renal/GU negative Renal ROS     Musculoskeletal negative musculoskeletal ROS (+)   Abdominal   Peds  Hematology negative hematology ROS (+)   Anesthesia Other Findings   Reproductive/Obstetrics (+) Pregnancy                           Anesthesia Physical Anesthesia Plan  ASA: II  Anesthesia Plan: Epidural   Post-op Pain Management:    Induction:   Airway Management Planned:   Additional Equipment:   Intra-op Plan:   Post-operative Plan:   Informed Consent: I have reviewed the patients History and Physical, chart, labs and discussed the procedure including the risks, benefits and alternatives for the proposed anesthesia with the patient or authorized representative who has indicated his/her understanding and acceptance.     Plan Discussed with:   Anesthesia Plan Comments:         Anesthesia Quick Evaluation

## 2013-01-16 DIAGNOSIS — O4290 Premature rupture of membranes, unspecified as to length of time between rupture and onset of labor, unspecified weeks of gestation: Secondary | ICD-10-CM | POA: Diagnosis present

## 2013-01-16 LAB — RPR: RPR Ser Ql: NONREACTIVE

## 2013-01-16 MED ORDER — LACTATED RINGERS IV SOLN: INTRAVENOUS | Status: DC

## 2013-01-16 MED ORDER — LACTATED RINGERS IV SOLN
INTRAVENOUS | Status: DC
  Administered 2013-01-16: 300 mL via INTRAUTERINE

## 2013-01-16 NOTE — Progress Notes (Signed)
Dr. Jean Rosenthal Christell Constant made aware of 6 minute decel and minimal variability of FHR and continued variables and early's. Advised that Pitocin now off, IV fluid bolus going. Orders recv'd to leave pitocin off at present until baby recovers.

## 2013-01-16 NOTE — Consult Note (Signed)
Neonatology Note:  Attendance at Code Apgar:   Our team responded to a Code Apgar call to room # 168 following NSVD, due to infant with apnea. The requesting physician was Dr. Tamela Oddi. The mother is a G2P0A1 A pos, GBS neg with an uncomplicated pregnancy. ROM occurred 25  hours PTD and the fluid was clear. There was no fetal distress prior to delivery other than variable decelerations with contractions. The mother had received no narcotics nor was she on magnesium sulfate. At delivery, the baby was OP. The baby was apneic. The OB nursing staff in attendance gave vigorous stimulation and a Code Apgar was called. Our team arrived at 3 minutes of life, at which time the baby was pale, flaccid, glassy-eyed and staring, with HR about 100, with little resp effort. We bulb suctioned and gave vigorous stimulation, but he remained apneic and the HR dropped to about 60-70, so PPV was applied for about 1 min, with good response. The baby's color and HR normalized very quickly, and he began to breathe regularly. The baby cried out loud at about 4-5 minutes of life. Tone was still decreased, but had normalized by 10 min of life. The baby remained pink and well-perfused. Ap (1-min Apgar score not yet assigned by OB nursing staff)/8/9 at 1, 5, and 10 min. Maternal temp just after delivery was 102.9 degrees. PE of baby was remarkable for female-appearing genitalia, but neither testicle is palpable. The penis is well-developed and there is no hypospadias, and the scrotum is rugated and hyperpigmented. I spoke with the parents in the DR, letting them know that an ultrasound to look for testes might be necessary tomorrow morning. I let them know that, although the baby appears female and probably is, that, in the absence of a palpable testicle, I cannot be sure that the baby is female. I called and spoke with Dr. Jenne Pane, who is on call for Dr. Excell Seltzer, to inform her of all the above, then transferred the baby to the Pediatrician's  care.   Doretha Sou, MD

## 2013-01-16 NOTE — Progress Notes (Signed)
Spoke with Dr. Tamela Oddi, allow patient to rest for 30 minutes then resume pushing.

## 2013-01-16 NOTE — H&P (Signed)
Gina Hayden is a 32 y.o. female presenting for rupture of membranes. Maternal Medical History:  Reason for admission: Rupture of membranes.   Fetal activity: Perceived fetal activity is normal.    Prenatal complications: no prenatal complications Prenatal Complications - Diabetes: none.    OB History   Grav Para Term Preterm Abortions TAB SAB Ect Mult Living   2    1 1          Past Medical History  Diagnosis Date  . No pertinent past medical history    Past Surgical History  Procedure Laterality Date  . Wisdom tooth extraction    . Dilation and curettage of uterus      abortion   Family History: family history includes Hyperlipidemia in her mother and Hypertension in her mother and sister. Social History:  reports that she has never smoked. She does not have any smokeless tobacco history on file. She reports that  drinks alcohol. She reports that she does not use illicit drugs.       Review of Systems  Constitutional: Negative for fever.  Eyes: Negative for blurred vision.  Respiratory: Negative for shortness of breath.   Gastrointestinal: Negative for vomiting.  Skin: Negative for rash.  Neurological: Negative for headaches.    Dilation: 3 Effacement (%): 70 Station: -2 Exam by:: Bertram Millard, RN Blood pressure 124/79, pulse 91, temperature 98.7 F (37.1 C), temperature source Oral, resp. rate 18, height 5\' 2"  (1.575 m), weight 165 lb 6 oz (75.014 kg), last menstrual period 04/30/2012, SpO2 98.00%. Maternal Exam:  Abdomen: not evaluated.  Introitus: not evaluated.   Cervix: not evaluated.   Fetal Exam Fetal Monitor Review: Variability: moderate (6-25 bpm).   Pattern: accelerations present and no decelerations.    Fetal State Assessment: Category I - tracings are normal.     Physical Exam  Constitutional: She appears well-developed.  HENT:  Head: Normocephalic.  Neck: Neck supple. No thyromegaly present.  Cardiovascular: Normal rate and regular  rhythm.   Respiratory: Breath sounds normal.  GI: Soft. Bowel sounds are normal.  Skin: No rash noted.    Prenatal labs: ABO, Rh: A/Positive/-- (12/17 0000) Antibody: Negative (12/17 0000) Rubella: Immune (12/17 0000) RPR: NON REACTIVE (06/28 2135)  HBsAg: Negative (12/17 0000)  HIV: Non-reactive (12/17 0000)  GBS: Negative (06/29 0000)   Assessment/Plan: [place]ipara @ [redacted]w[redacted]d.  PROM.  GBS negative. Category I FHT  Admit Low dose Pitocin per protocol Monitor for signs/symptoms of infection   JACKSON-MOORE,Promiss Labarbera A 01/16/2013, 8:01 AM

## 2013-01-17 ENCOUNTER — Encounter (HOSPITAL_COMMUNITY): Payer: Self-pay | Admitting: *Deleted

## 2013-01-17 MED ORDER — DIBUCAINE 1 % RE OINT: 1.0000 "application " | TOPICAL_OINTMENT | RECTAL | Status: DC | PRN

## 2013-01-17 MED ORDER — LANOLIN HYDROUS EX OINT: TOPICAL_OINTMENT | CUTANEOUS | Status: DC | PRN

## 2013-01-17 MED ORDER — WITCH HAZEL-GLYCERIN EX PADS: 1.0000 "application " | MEDICATED_PAD | CUTANEOUS | Status: DC | PRN

## 2013-01-17 MED ORDER — ZOLPIDEM TARTRATE 5 MG PO TABS: 5.0000 mg | ORAL_TABLET | Freq: Every evening | ORAL | Status: DC | PRN

## 2013-01-17 MED ORDER — DIPHENHYDRAMINE HCL 25 MG PO CAPS: 25.0000 mg | ORAL_CAPSULE | Freq: Four times a day (QID) | ORAL | Status: DC | PRN

## 2013-01-17 MED ORDER — OXYCODONE-ACETAMINOPHEN 5-325 MG PO TABS
1.0000 | ORAL_TABLET | ORAL | Status: DC | PRN
  Administered 2013-01-17 (×2): 1 via ORAL
  Administered 2013-01-18: 2 via ORAL
  Filled 2013-01-17: qty 1
  Filled 2013-01-17: qty 2
  Filled 2013-01-17: qty 1

## 2013-01-17 MED ORDER — SENNOSIDES-DOCUSATE SODIUM 8.6-50 MG PO TABS
2.0000 | ORAL_TABLET | Freq: Every day | ORAL | Status: DC
  Administered 2013-01-17: 2 via ORAL

## 2013-01-17 MED ORDER — IBUPROFEN 600 MG PO TABS
600.0000 mg | ORAL_TABLET | Freq: Four times a day (QID) | ORAL | Status: DC
  Administered 2013-01-17 – 2013-01-18 (×5): 600 mg via ORAL
  Filled 2013-01-17 (×5): qty 1

## 2013-01-17 MED ORDER — FERROUS SULFATE 325 (65 FE) MG PO TABS
325.0000 mg | ORAL_TABLET | Freq: Two times a day (BID) | ORAL | Status: DC
  Administered 2013-01-17 – 2013-01-18 (×3): 325 mg via ORAL
  Filled 2013-01-17 (×3): qty 1

## 2013-01-17 MED ORDER — MAGNESIUM HYDROXIDE 400 MG/5ML PO SUSP: 30.0000 mL | ORAL | Status: DC | PRN

## 2013-01-17 MED ORDER — ONDANSETRON HCL 4 MG PO TABS: 4.0000 mg | ORAL_TABLET | ORAL | Status: DC | PRN

## 2013-01-17 MED ORDER — PRENATAL MULTIVITAMIN CH
1.0000 | ORAL_TABLET | Freq: Every day | ORAL | Status: DC
  Administered 2013-01-17: 1 via ORAL
  Filled 2013-01-17: qty 1

## 2013-01-17 MED ORDER — MEASLES, MUMPS & RUBELLA VAC ~~LOC~~ INJ
0.5000 mL | INJECTION | Freq: Once | SUBCUTANEOUS | Status: DC
  Filled 2013-01-17: qty 0.5

## 2013-01-17 MED ORDER — ONDANSETRON HCL 4 MG/2ML IJ SOLN: 4.0000 mg | INTRAMUSCULAR | Status: DC | PRN

## 2013-01-17 MED ORDER — TETANUS-DIPHTH-ACELL PERTUSSIS 5-2.5-18.5 LF-MCG/0.5 IM SUSP
0.5000 mL | Freq: Once | INTRAMUSCULAR | Status: AC
  Administered 2013-01-17: 0.5 mL via INTRAMUSCULAR

## 2013-01-17 MED ORDER — BENZOCAINE-MENTHOL 20-0.5 % EX AERO
1.0000 "application " | INHALATION_SPRAY | CUTANEOUS | Status: DC | PRN
  Administered 2013-01-17: 1 via TOPICAL
  Filled 2013-01-17: qty 56

## 2013-01-17 NOTE — Progress Notes (Signed)
Patient ID: Gina Hayden, female   DOB: 11/13/1980, 32 y.o.   MRN: [id number] Postpartum day one Vital signs normal Fundus firm Lochia moderate No complaints

## 2013-01-17 NOTE — Anesthesia Postprocedure Evaluation (Signed)
  Anesthesia Post-op Note  Patient: Gina Hayden  Procedure(s) Performed: * No procedures listed *  Patient Location: PACU and Mother/Baby  Anesthesia Type:Epidural  Level of Consciousness: awake  Airway and Oxygen Therapy: Patient Spontanous Breathing  Post-op Pain: none  Post-op Assessment: Patient's Cardiovascular Status Stable, Respiratory Function Stable, Patent Airway, No signs of Nausea or vomiting, Adequate PO intake, Pain level controlled, No headache, No backache, No residual numbness and No residual motor weakness  Post-op Vital Signs: Reviewed and stable  Complications: No apparent anesthesia complications

## 2013-01-17 NOTE — Progress Notes (Signed)
UR chart review completed.  

## 2013-01-18 NOTE — Lactation Note (Signed)
This note was copied from the chart of Gina Florinda Taflinger. Lactation Consultation Note  Patient Name: Gina Hayden [id number] Date: 01/18/2013 Reason for consult: Follow-up assessment;Infant < 6lbs Assisted Mom with positioning and obtaining more depth with latch. Baby demonstrates a good suckling pattern at the breast. Mom reports some mild tenderness on left nipple, no breakdown noted but slightly red. Care for sore nipples reviewed. Advised to apply EBM to sore nipples. Mom reports she pumped 1 time yesterday, she did not get any breast milk with pumping. She has DEBP at home, Encouraged to pump at least 4 times day to encourage milk production and give the baby back any amount of EBM available. Demonstrated how to supplement using curved tipped syringe. Advised Mom baby should be at the breast 8-12 times in 24 hours, cluster feeding reviewed. Engorgement care reviewed if needed. Advised of OP services and support group.   Maternal Data    Feeding Feeding Type: Breast Milk Feeding method: Breast Length of feed: 25 min  LATCH Score/Interventions Latch: Grasps breast easily, tongue down, lips flanged, rhythmical sucking. Intervention(s): Adjust position;Assist with latch  Audible Swallowing: A few with stimulation  Type of Nipple: Everted at rest and after stimulation  Comfort (Breast/Nipple): Soft / non-tender     Hold (Positioning): Assistance needed to correctly position infant at breast and maintain latch.  LATCH Score: 8  Lactation Tools Discussed/Used Tools: Pump Breast pump type: Double-Electric Breast Pump   Consult Status Consult Status: Complete Date: 01/18/13 Follow-up type: In-patient    Gina Hayden 01/18/2013, 9:27 AM

## 2013-01-18 NOTE — Discharge Summary (Signed)
Obstetric Discharge Summary Reason for Admission: onset of labor Prenatal Procedures: none Intrapartum Procedures: spontaneous vaginal delivery Postpartum Procedures: none Complications-Operative and Postpartum: none Hemoglobin  Date Value Range Status  01/15/2013 12.4  12.0 - 15.0 g/dL Final     HCT  Date Value Range Status  01/15/2013 36.8  36.0 - 46.0 % Final    Physical Exam:  General: alert Lochia: appropriate Uterine Fundus: firm Incision: healing well DVT Evaluation: No evidence of DVT seen on physical exam.  Discharge Diagnoses: Term Pregnancy-delivered  Discharge Information: Date: 01/18/2013 Activity: pelvic rest Diet: routine Medications: Percocet Condition: stable Instructions: refer to practice specific booklet Discharge to: home Follow-up Information   Follow up with MARSHALL,BERNARD A, MD. Schedule an appointment as soon as possible for a visit in 6 weeks.   Contact information:   [address] [place] [address] [phone]       Newborn Data: Live born female  Birth Weight: 6 lb 0.7 oz (2741 g) APGAR: 5, 8  Home with mother.  MARSHALL,BERNARD A 01/18/2013, 6:45 AM

## 2013-01-18 NOTE — Progress Notes (Signed)
Patient ID: Gina Hayden, female   DOB: [birth year], 32 y.o.   MRN: [id number] Postpartum day 2 Vital signs normal Fundus firm Lochia moderate Doing well discharge today

## 2013-07-21 NOTE — L&D Delivery Note (Signed)
Delivery Note At 4:26 AM a viable female was delivered via Vaginal, Spontaneous Delivery (Presentation: ;  ).  APGAR: 8, 9; weight .   Placenta status: Intact, Spontaneous.  Cord: 3 vessels with the following complications: None.  Cord pH: not done  Anesthesia: Epidural  Episiotomy:  Lacerations:  Suture Repair: 2.0 vicryl Est. Blood Loss (mL):   Mom to postpartum.  Baby to Couplet care / Skin to Skin.  MARSHALL,BERNARD A 01/21/2014, 5:14 AM

## 2013-07-27 LAB — OB RESULTS CONSOLE HEPATITIS B SURFACE ANTIGEN: Hepatitis B Surface Ag: NEGATIVE

## 2013-07-27 LAB — OB RESULTS CONSOLE ABO/RH: RH Type: POSITIVE

## 2013-07-27 LAB — OB RESULTS CONSOLE ANTIBODY SCREEN: ANTIBODY SCREEN: NEGATIVE

## 2013-07-27 LAB — OB RESULTS CONSOLE GC/CHLAMYDIA
Chlamydia: NEGATIVE
GC PROBE AMP, GENITAL: NEGATIVE

## 2013-07-27 LAB — OB RESULTS CONSOLE HIV ANTIBODY (ROUTINE TESTING): HIV: NONREACTIVE

## 2013-07-27 LAB — OB RESULTS CONSOLE RPR: RPR: NONREACTIVE

## 2013-07-27 LAB — OB RESULTS CONSOLE RUBELLA ANTIBODY, IGM: Rubella: IMMUNE

## 2013-08-18 ENCOUNTER — Inpatient Hospital Stay (HOSPITAL_COMMUNITY)
Admission: AD | Admit: 2013-08-18 | Discharge: 2013-08-18 | Disposition: A | Discharge status: Home / Self Care | Payer: Medicaid Other | Source: Ambulatory Visit | Attending: Obstetrics | Admitting: Obstetrics

## 2013-08-18 ENCOUNTER — Encounter (HOSPITAL_COMMUNITY): Payer: Self-pay | Admitting: *Deleted

## 2013-08-18 DIAGNOSIS — N949 Unspecified condition associated with female genital organs and menstrual cycle: Secondary | ICD-10-CM

## 2013-08-18 DIAGNOSIS — R109 Unspecified abdominal pain: Secondary | ICD-10-CM | POA: Insufficient documentation

## 2013-08-18 DIAGNOSIS — M545 Low back pain, unspecified: Secondary | ICD-10-CM | POA: Insufficient documentation

## 2013-08-18 DIAGNOSIS — O9989 Other specified diseases and conditions complicating pregnancy, childbirth and the puerperium: Principal | ICD-10-CM

## 2013-08-18 DIAGNOSIS — O99891 Other specified diseases and conditions complicating pregnancy: Secondary | ICD-10-CM | POA: Insufficient documentation

## 2013-08-18 LAB — URINE MICROSCOPIC-ADD ON

## 2013-08-18 LAB — URINALYSIS, ROUTINE W REFLEX MICROSCOPIC
BILIRUBIN URINE: NEGATIVE
GLUCOSE, UA: NEGATIVE mg/dL
KETONES UR: NEGATIVE mg/dL
Nitrite: NEGATIVE
PH: 6.5 (ref 5.0–8.0)
PROTEIN: NEGATIVE mg/dL
Specific Gravity, Urine: 1.01 (ref 1.005–1.030)
Urobilinogen, UA: 0.2 mg/dL (ref 0.0–1.0)

## 2013-08-18 MED ORDER — ACETAMINOPHEN 500 MG PO TABS
1000.0000 mg | ORAL_TABLET | Freq: Once | ORAL | Status: AC
  Administered 2013-08-18: 1000 mg via ORAL
  Filled 2013-08-18: qty 2

## 2013-08-18 NOTE — MAU Note (Signed)
Low back pain, mainly on left side started last night.  Cramping in lower abd started today.

## 2013-08-18 NOTE — MAU Note (Signed)
Pt presents to MAU with c/o back pain and abdominal pain. Pt states back pain started last night and started having abdominal pain today.

## 2013-08-18 NOTE — MAU Provider Note (Signed)
Chief Complaint  Patient presents with  . Back Pain  . Abdominal Pain    S: Gina Hayden is a 33 y.o. G3P1011 at 6175w5d presenting with onsetA lower abdominal, right groin and low back discomfort while she was moving around at work today pain is worse with moving. The pain is exacerbated by walking and changing positions. No dysuria, urgency or frequency. She denies contractions, vaginal bleeding or leakage of fluid. Fetus is active. Has 897 mo old baby.   ROS: Negative except as noted above.  O: Filed Vitals:   08/18/13 1628  BP: 127/76  Pulse: 104  Temp: 98.1 F (36.7 C)  Resp: 20    Gen: NAD Abd: soft, mildly tender in lower abd and groin. DT 155 Back: neg CVAT Cx: post, L/C/H                Results for orders placed during the hospital encounter of 08/18/13 (from the past 24 hour(s))  URINALYSIS, ROUTINE W REFLEX MICROSCOPIC     Status: Abnormal   Collection Time    08/18/13  4:35 PM      Result Value Range   Color, Urine YELLOW  YELLOW   APPearance HAZY (*) CLEAR   Specific Gravity, Urine 1.010  1.005 - 1.030   pH 6.5  5.0 - 8.0   Glucose, UA NEGATIVE  NEGATIVE mg/dL   Hgb urine dipstick TRACE (*) NEGATIVE   Bilirubin Urine NEGATIVE  NEGATIVE   Ketones, ur NEGATIVE  NEGATIVE mg/dL   Protein, ur NEGATIVE  NEGATIVE mg/dL   Urobilinogen, UA 0.2  0.0 - 1.0 mg/dL   Nitrite NEGATIVE  NEGATIVE   Leukocytes, UA TRACE (*) NEGATIVE  URINE MICROSCOPIC-ADD ON     Status: Abnormal   Collection Time    08/18/13  4:35 PM      Result Value Range   Squamous Epithelial / LPF FEW (*) RARE   WBC, UA 0-2  <3 WBC/hpf   RBC / HPF 0-2  <3 RBC/hpf   ZOX:[id number]:Acetaminophen 1000 mg given with relief   A: Round Ligament Pain in G3P1011 at 2775w5d  P:  Reassurance given and general relief measures reviewed: avoidance of precipitating movements, instructions on abdominal tightening/pelvic rock exercises, abdominal binder, rest with hip flexion. Follow-up Information   Follow up with  MARSHALL,BERNARD A, MD In 1 week.   Specialty:  Obstetrics and Gynecology   Contact information:   [address] [place] [id number] [phone]

## 2013-09-04 ENCOUNTER — Encounter (HOSPITAL_COMMUNITY): Payer: Self-pay | Admitting: Emergency Medicine

## 2013-09-04 ENCOUNTER — Emergency Department (INDEPENDENT_AMBULATORY_CARE_PROVIDER_SITE_OTHER)
Admission: EM | Admit: 2013-09-04 | Discharge: 2013-09-04 | Disposition: A | Discharge status: Home / Self Care | Payer: Medicaid Other | Source: Home / Self Care | Attending: Family Medicine | Admitting: Family Medicine

## 2013-09-04 DIAGNOSIS — S99929A Unspecified injury of unspecified foot, initial encounter: Secondary | ICD-10-CM

## 2013-09-04 DIAGNOSIS — S8990XA Unspecified injury of unspecified lower leg, initial encounter: Secondary | ICD-10-CM

## 2013-09-04 DIAGNOSIS — S838X1A Sprain of other specified parts of right knee, initial encounter: Secondary | ICD-10-CM

## 2013-09-04 DIAGNOSIS — S99919A Unspecified injury of unspecified ankle, initial encounter: Secondary | ICD-10-CM

## 2013-09-04 MED ORDER — HYDROCODONE-ACETAMINOPHEN 5-325 MG PO TABS: 1.0000 | ORAL_TABLET | Freq: Four times a day (QID) | ORAL | Status: DC | PRN

## 2013-09-04 NOTE — ED Provider Notes (Signed)
Gina Hayden is a 33 y.o. female who presents to Urgent Care today for right knee pain starting yesterday. Patient rotated on a plan to slightly flexed right knee last night. She felt a pop in her knee gave way. She has significant knee pain and swelling. She has difficulty with motion because of pain. She denies any radiating pain weakness or numbness. No fevers chills nausea or vomiting. She feels well otherwise.  She is currently about 4 months pregnant   Past Medical History  Diagnosis Date  . No pertinent past medical history    History  Substance Use Topics  . Smoking status: Never Smoker   . Smokeless tobacco: Not on file  . Alcohol Use: Yes     Comment: social alcohol before pregnancy   ROS as above Medications: No current facility-administered medications for this encounter.   Current Outpatient Prescriptions  Medication Sig Dispense Refill  . HYDROcodone-acetaminophen (NORCO/VICODIN) 5-325 MG per tablet Take 1 tablet by mouth every 6 (six) hours as needed.  15 tablet  0  . Prenatal Vit-Fe Fumarate-FA (PRENATAL MULTIVITAMIN) TABS Take 1 tablet by mouth at bedtime.         Exam:  BP 120/75  Pulse 102  Temp(Src) 97.7 F (36.5 C) (Oral)  Resp 18  SpO2 97% Gen: Well NAD RIGHT KNEE:  Moderate effusion present. Tender palpation medial joint line Range of motion 0-100. Positive medial McMurray's test. Negative lateral McMurray's test  Stable ligamentous exam Extension and flexion strength are intact Capillary refill and sensation are intact distally  Contralateral left knee normal-appearing nontender normal motion negative McMurray.     Assessment and Plan: 33 y.o. female with  probable right medial meniscus injury. Plan for hinged knee brace and Norco. [facility]  Discussed warning signs or symptoms. Please see discharge instructions. Patient expresses understanding.    Rodolph BongEvan S Nell Gales, MD 09/04/13 [phone]

## 2013-09-04 NOTE — ED Notes (Signed)
C/o leg pain States last night she was leaning over while she was holding her son when she heard a pop sound States tylenol was used for pain

## 2013-09-04 NOTE — Discharge Instructions (Signed)
Thank you for coming in today. Get a hinged knee brace Use Norco for severe pain as needed [facility]

## 2013-12-14 LAB — OB RESULTS CONSOLE GBS: STREP GROUP B AG: NEGATIVE

## 2014-01-06 ENCOUNTER — Inpatient Hospital Stay (HOSPITAL_COMMUNITY)
Admission: AD | Admit: 2014-01-06 | Discharge: 2014-01-07 | Disposition: A | Discharge status: Home / Self Care | Payer: Medicaid Other | Source: Ambulatory Visit | Attending: Obstetrics | Admitting: Obstetrics

## 2014-01-06 ENCOUNTER — Encounter (HOSPITAL_COMMUNITY): Payer: Self-pay

## 2014-01-06 DIAGNOSIS — O479 False labor, unspecified: Secondary | ICD-10-CM | POA: Insufficient documentation

## 2014-01-06 NOTE — MAU Note (Signed)
Contractions every 5 min, started at 7:30. Reports started leaking about 10 min ago, clear small amt.  Baby moving well per pt.  Denies bleeding.

## 2014-01-06 NOTE — MAU Provider Note (Signed)
Ms. Gina PatellaDaria T Hayden is a 33 y.o. G3P1011 at 6375w0d  who presents to MAU today complaining contractions q 5 minutes and LOF. She denies vaginal bleeding or complications with the pregnancy. She states good fetal movement.   BP 124/79  Pulse 110  Temp(Src) 97.5 F (36.4 C) (Oral)  Resp 20  Ht 5\' 1"  (1.549 m)  Wt 179 lb 6 oz (81.364 kg)  BMI 33.91 kg/m2  SpO2 99% GENERAL: Well-developed, well-nourished female in no acute distress.  HEENT: Normocephalic, atraumatic.  LUNGS: Normal effort HEART: Regular rate  ABDOMEN: Soft, nontender, nondistended.  PELVIC: Normal external female genitalia. Vagina is pink and rugated.  Normal discharge.  Negative pooling. Gravid uterus.   EXTREMITIES: No cyanosis, clubbing, or edema  Fern - negative  Fetal Monitoring:  Baseline: 120 bpm, moderate variability, + accelerations, no decelerations Contractions: irregular, q 3-5 minutes  A: Membranes intact  P: RN to report to MD on call for further instructions and management  Freddi StarrJulie N Ethier, PA-C 01/06/2014 11:15 PM

## 2014-01-07 NOTE — Discharge Instructions (Signed)
Keep Doctor appointments  Fetal Movement Counts Patient Name: __________________________________________________ Patient Due Date: ____________________ Performing a fetal movement count is highly recommended in high-risk pregnancies, but it is good for every pregnant woman to do. Your caregiver may ask you to start counting fetal movements at 28 weeks of the pregnancy. Fetal movements often increase:  After eating a full meal.  After physical activity.  After eating or drinking something sweet or cold.  At rest. Pay attention to when you feel the baby is most active. This will help you notice a pattern of your baby's sleep and wake cycles and what factors contribute to an increase in fetal movement. It is important to perform a fetal movement count at the same time each day when your baby is normally most active.  HOW TO COUNT FETAL MOVEMENTS 1. Find a quiet and comfortable area to sit or lie down on your left side. Lying on your left side provides the best blood and oxygen circulation to your baby. 2. Write down the day and time on a sheet of paper or in a journal. 3. Start counting kicks, flutters, swishes, rolls, or jabs in a 2 hour period. You should feel at least 10 movements within 2 hours. 4. If you do not feel 10 movements in 2 hours, wait 2-3 hours and count again. Look for a change in the pattern or not enough counts in 2 hours. SEEK MEDICAL CARE IF:  You feel less than 10 counts in 2 hours, tried twice.  There is no movement in over an hour.  The pattern is changing or taking longer each day to reach 10 counts in 2 hours.  You feel the baby is not moving as he or she usually does. Date: ____________ Movements: ____________ Start time: ____________ Doreatha Martin time: ____________  Date: ____________ Movements: ____________ Start time: ____________ Doreatha Martin time: ____________ Date: ____________ Movements: ____________ Start time: ____________ Doreatha Martin time: ____________ Date:  ____________ Movements: ____________ Start time: ____________ Doreatha Martin time: ____________ Date: ____________ Movements: ____________ Start time: ____________ Doreatha Martin time: ____________ Date: ____________ Movements: ____________ Start time: ____________ Doreatha Martin time: ____________ Date: ____________ Movements: ____________ Start time: ____________ Doreatha Martin time: ____________ Date: ____________ Movements: ____________ Start time: ____________ Doreatha Martin time: ____________  Date: ____________ Movements: ____________ Start time: ____________ Doreatha Martin time: ____________ Date: ____________ Movements: ____________ Start time: ____________ Doreatha Martin time: ____________ Date: ____________ Movements: ____________ Start time: ____________ Doreatha Martin time: ____________ Date: ____________ Movements: ____________ Start time: ____________ Doreatha Martin time: ____________ Date: ____________ Movements: ____________ Start time: ____________ Doreatha Martin time: ____________ Date: ____________ Movements: ____________ Start time: ____________ Doreatha Martin time: ____________ Date: ____________ Movements: ____________ Start time: ____________ Doreatha Martin time: ____________  Date: ____________ Movements: ____________ Start time: ____________ Doreatha Martin time: ____________ Date: ____________ Movements: ____________ Start time: ____________ Doreatha Martin time: ____________ Date: ____________ Movements: ____________ Start time: ____________ Doreatha Martin time: ____________ Date: ____________ Movements: ____________ Start time: ____________ Doreatha Martin time: ____________ Date: ____________ Movements: ____________ Start time: ____________ Doreatha Martin time: ____________ Date: ____________ Movements: ____________ Start time: ____________ Doreatha Martin time: ____________ Date: ____________ Movements: ____________ Start time: ____________ Doreatha Martin time: ____________  Date: ____________ Movements: ____________ Start time: ____________ Doreatha Martin time: ____________ Date: ____________ Movements: ____________ Start  time: ____________ Doreatha Martin time: ____________ Date: ____________ Movements: ____________ Start time: ____________ Doreatha Martin time: ____________ Date: ____________ Movements: ____________ Start time: ____________ Doreatha Martin time: ____________ Date: ____________ Movements: ____________ Start time: ____________ Doreatha Martin time: ____________ Date: ____________ Movements: ____________ Start time: ____________ Doreatha Martin time: ____________ Date: ____________ Movements: ____________ Start time: ____________ Doreatha Martin time: ____________  Date: ____________ Movements: ____________ Start  time: ____________ Doreatha MartinFinish time: ____________ Date: ____________ Movements: ____________ Start time: ____________ Doreatha MartinFinish time: ____________ Date: ____________ Movements: ____________ Start time: ____________ Doreatha MartinFinish time: ____________ Date: ____________ Movements: ____________ Start time: ____________ Doreatha MartinFinish time: ____________ Date: ____________ Movements: ____________ Start time: ____________ Doreatha MartinFinish time: ____________ Date: ____________ Movements: ____________ Start time: ____________ Doreatha MartinFinish time: ____________ Date: ____________ Movements: ____________ Start time: ____________ Doreatha MartinFinish time: ____________  Date: ____________ Movements: ____________ Start time: ____________ Doreatha MartinFinish time: ____________ Date: ____________ Movements: ____________ Start time: ____________ Doreatha MartinFinish time: ____________ Date: ____________ Movements: ____________ Start time: ____________ Doreatha MartinFinish time: ____________ Date: ____________ Movements: ____________ Start time: ____________ Doreatha MartinFinish time: ____________ Date: ____________ Movements: ____________ Start time: ____________ Doreatha MartinFinish time: ____________ Date: ____________ Movements: ____________ Start time: ____________ Doreatha MartinFinish time: ____________ Date: ____________ Movements: ____________ Start time: ____________ Doreatha MartinFinish time: ____________  Date: ____________ Movements: ____________ Start time: ____________ Doreatha MartinFinish time:  ____________ Date: ____________ Movements: ____________ Start time: ____________ Doreatha MartinFinish time: ____________ Date: ____________ Movements: ____________ Start time: ____________ Doreatha MartinFinish time: ____________ Date: ____________ Movements: ____________ Start time: ____________ Doreatha MartinFinish time: ____________ Date: ____________ Movements: ____________ Start time: ____________ Doreatha MartinFinish time: ____________ Date: ____________ Movements: ____________ Start time: ____________ Doreatha MartinFinish time: ____________ Date: ____________ Movements: ____________ Start time: ____________ Doreatha MartinFinish time: ____________  Date: ____________ Movements: ____________ Start time: ____________ Doreatha MartinFinish time: ____________ Date: ____________ Movements: ____________ Start time: ____________ Doreatha MartinFinish time: ____________ Date: ____________ Movements: ____________ Start time: ____________ Doreatha MartinFinish time: ____________ Date: ____________ Movements: ____________ Start time: ____________ Doreatha MartinFinish time: ____________ Date: ____________ Movements: ____________ Start time: ____________ Doreatha MartinFinish time: ____________ Date: ____________ Movements: ____________ Start time: ____________ Doreatha MartinFinish time: ____________ Document Released: 08/06/2006 Document Revised: 06/23/2012 Document Reviewed: 05/03/2012 [employer] Patient Information 2015 [employer]. This information is not intended to replace advice given to you by your health care provider. Make sure you discuss any questions you have with your health care provider. Braxton Hicks Contractions Contractions of the uterus can occur throughout pregnancy. Contractions are not always a sign that you are in labor.  WHAT ARE BRAXTON HICKS CONTRACTIONS?  Contractions that occur before labor are called Braxton Hicks contractions, or false labor. Toward the end of pregnancy (32-34 weeks), these contractions can develop more often and may become more forceful. This is not true labor because these contractions do not result in opening (dilatation)  and thinning of the cervix. They are sometimes difficult to tell apart from true labor because these contractions can be forceful and people have different pain tolerances. You should not feel embarrassed if you go to the hospital with false labor. Sometimes, the only way to tell if you are in true labor is for your health care provider to look for changes in the cervix. If there are no prenatal problems or other health problems associated with the pregnancy, it is completely safe to be sent home with false labor and await the onset of true labor. HOW CAN YOU TELL THE DIFFERENCE BETWEEN TRUE AND FALSE LABOR? False Labor  The contractions of false labor are usually shorter and not as hard as those of true labor.   The contractions are usually irregular.   The contractions are often felt in the front of the lower abdomen and in the groin.   The contractions may go away when you walk around or change positions while lying down.   The contractions get weaker and are shorter lasting as time goes on.   The contractions do not usually become progressively stronger, regular, and closer together as with true labor.  True Labor  Contractions in true labor last  30-70 seconds, become very regular, usually become more intense, and increase in frequency.   The contractions do not go away with walking.   The discomfort is usually felt in the top of the uterus and spreads to the lower abdomen and low back.   True labor can be determined by your health care provider with an exam. This will show that the cervix is dilating and getting thinner.  WHAT TO REMEMBER  Keep up with your usual exercises and follow other instructions given by your health care provider.   Take medicines as directed by your health care provider.   Keep your regular prenatal appointments.   Eat and drink lightly if you think you are going into labor.   If Braxton Hicks contractions are making you uncomfortable:    Change your position from lying down or resting to walking, or from walking to resting.   Sit and rest in a tub of warm water.   Drink 2-3 glasses of water. Dehydration may cause these contractions.   Do slow and deep breathing several times an hour.  WHEN SHOULD I SEEK IMMEDIATE MEDICAL CARE? Seek immediate medical care if:  Your contractions become stronger, more regular, and closer together.   You have fluid leaking or gushing from your vagina.   You have a fever.   You pass blood-tinged mucus.   You have vaginal bleeding.   You have continuous abdominal pain.   You have low back pain that you never had before.   You feel your baby's head pushing down and causing pelvic pressure.   Your baby is not moving as much as it used to.  Document Released: 07/07/2005 Document Revised: 07/12/2013 Document Reviewed: 04/18/2013 [facility] Patient Information 2015 [employer] [place]. This information is not intended to replace advice given to you by your health care provider. Make sure you discuss any questions you have with your health care provider.

## 2014-01-19 ENCOUNTER — Telehealth (HOSPITAL_COMMUNITY): Payer: Self-pay | Admitting: *Deleted

## 2014-01-19 NOTE — Telephone Encounter (Signed)
Preadmission screen  

## 2014-01-20 ENCOUNTER — Inpatient Hospital Stay (HOSPITAL_COMMUNITY)
Admission: RE | Admit: 2014-01-20 | Discharge: 2014-01-22 | DRG: 775 | Disposition: A | Discharge status: Home / Self Care | Payer: Medicaid Other | Source: Ambulatory Visit | Attending: Obstetrics | Admitting: Obstetrics

## 2014-01-20 ENCOUNTER — Encounter (HOSPITAL_COMMUNITY): Payer: Medicaid Other | Admitting: Anesthesiology

## 2014-01-20 ENCOUNTER — Inpatient Hospital Stay (HOSPITAL_COMMUNITY): Payer: Medicaid Other | Admitting: Anesthesiology

## 2014-01-20 ENCOUNTER — Encounter (HOSPITAL_COMMUNITY): Payer: Self-pay

## 2014-01-20 DIAGNOSIS — Z3490 Encounter for supervision of normal pregnancy, unspecified, unspecified trimester: Secondary | ICD-10-CM

## 2014-01-20 DIAGNOSIS — O99891 Other specified diseases and conditions complicating pregnancy: Secondary | ICD-10-CM | POA: Diagnosis present

## 2014-01-20 LAB — CBC
HEMATOCRIT: 36.9 % (ref 36.0–46.0)
Hemoglobin: 11.9 g/dL — ABNORMAL LOW (ref 12.0–15.0)
MCH: 28.1 pg (ref 26.0–34.0)
MCHC: 32.2 g/dL (ref 30.0–36.0)
MCV: 87.2 fL (ref 78.0–100.0)
Platelets: 201 10*3/uL (ref 150–400)
RBC: 4.23 MIL/uL (ref 3.87–5.11)
RDW: 14.2 % (ref 11.5–15.5)
WBC: 7.5 10*3/uL (ref 4.0–10.5)

## 2014-01-20 LAB — RPR

## 2014-01-20 MED ORDER — DIPHENHYDRAMINE HCL 50 MG/ML IJ SOLN: 12.5000 mg | INTRAMUSCULAR | Status: DC | PRN

## 2014-01-20 MED ORDER — OXYTOCIN 40 UNITS IN LACTATED RINGERS INFUSION - SIMPLE MED: 1.0000 m[IU]/min | INTRAVENOUS | Status: DC

## 2014-01-20 MED ORDER — TERBUTALINE SULFATE 1 MG/ML IJ SOLN: 0.2500 mg | Freq: Once | INTRAMUSCULAR | Status: AC | PRN

## 2014-01-20 MED ORDER — EPHEDRINE 5 MG/ML INJ: 10.0000 mg | INTRAVENOUS | Status: DC | PRN

## 2014-01-20 MED ORDER — OXYTOCIN 40 UNITS IN LACTATED RINGERS INFUSION - SIMPLE MED
1.0000 m[IU]/min | INTRAVENOUS | Status: DC
Start: 2014-01-20 — End: 2014-01-20

## 2014-01-20 MED ORDER — LACTATED RINGERS IV SOLN
INTRAVENOUS | Status: DC
  Administered 2014-01-20: 21:00:00 via INTRAUTERINE

## 2014-01-20 MED ORDER — LIDOCAINE HCL (PF) 1 % IJ SOLN
INTRAMUSCULAR | Status: DC | PRN
  Administered 2014-01-20 (×2): 5 mL

## 2014-01-20 MED ORDER — OXYTOCIN 40 UNITS IN LACTATED RINGERS INFUSION - SIMPLE MED: 62.5000 mL/h | INTRAVENOUS | Status: DC

## 2014-01-20 MED ORDER — OXYTOCIN 40 UNITS IN LACTATED RINGERS INFUSION - SIMPLE MED
1.0000 m[IU]/min | INTRAVENOUS | Status: DC
  Administered 2014-01-20: 2 m[IU]/min via INTRAVENOUS
  Filled 2014-01-20: qty 1000

## 2014-01-20 MED ORDER — IBUPROFEN 600 MG PO TABS
600.0000 mg | ORAL_TABLET | Freq: Four times a day (QID) | ORAL | Status: DC | PRN
  Administered 2014-01-21: 600 mg via ORAL
  Filled 2014-01-20: qty 1

## 2014-01-20 MED ORDER — EPHEDRINE 5 MG/ML INJ
10.0000 mg | INTRAVENOUS | Status: DC | PRN
Start: 2014-01-20 — End: 2014-01-21

## 2014-01-20 MED ORDER — FLEET ENEMA 7-19 GM/118ML RE ENEM
1.0000 | ENEMA | Freq: Every day | RECTAL | Status: DC | PRN
Start: 2014-01-20 — End: 2014-01-21

## 2014-01-20 MED ORDER — LIDOCAINE HCL (PF) 1 % IJ SOLN
30.0000 mL | INTRAMUSCULAR | Status: DC | PRN
Start: 2014-01-20 — End: 2014-01-22
  Administered 2014-01-21: 30 mL via SUBCUTANEOUS
  Filled 2014-01-20: qty 30

## 2014-01-20 MED ORDER — OXYTOCIN 40 UNITS IN LACTATED RINGERS INFUSION - SIMPLE MED
1.0000 m[IU]/min | INTRAVENOUS | Status: DC
Start: 2014-01-20 — End: 2014-01-21

## 2014-01-20 MED ORDER — PHENYLEPHRINE 40 MCG/ML (10ML) SYRINGE FOR IV PUSH (FOR BLOOD PRESSURE SUPPORT)
80.0000 ug | PREFILLED_SYRINGE | INTRAVENOUS | Status: DC | PRN
  Filled 2014-01-20: qty 10

## 2014-01-20 MED ORDER — LACTATED RINGERS IV SOLN
500.0000 mL | Freq: Once | INTRAVENOUS | Status: AC
  Administered 2014-01-20: 10:00:00 via INTRAVENOUS

## 2014-01-20 MED ORDER — CITRIC ACID-SODIUM CITRATE 334-500 MG/5ML PO SOLN: 30.0000 mL | ORAL | Status: DC | PRN

## 2014-01-20 MED ORDER — PHENYLEPHRINE 40 MCG/ML (10ML) SYRINGE FOR IV PUSH (FOR BLOOD PRESSURE SUPPORT): 80.0000 ug | PREFILLED_SYRINGE | INTRAVENOUS | Status: DC | PRN

## 2014-01-20 MED ORDER — LACTATED RINGERS IV SOLN
500.0000 mL | INTRAVENOUS | Status: DC | PRN
  Administered 2014-01-20: 1000 mL via INTRAVENOUS
  Administered 2014-01-20 (×2): 500 mL via INTRAVENOUS

## 2014-01-20 MED ORDER — OXYTOCIN BOLUS FROM INFUSION: 500.0000 mL | INTRAVENOUS | Status: DC

## 2014-01-20 MED ORDER — FENTANYL 2.5 MCG/ML BUPIVACAINE 1/10 % EPIDURAL INFUSION (WH - ANES)
14.0000 mL/h | INTRAMUSCULAR | Status: DC | PRN
  Administered 2014-01-20 (×4): 14 mL/h via EPIDURAL
  Filled 2014-01-20 (×3): qty 125

## 2014-01-20 MED ORDER — ACETAMINOPHEN 325 MG PO TABS
650.0000 mg | ORAL_TABLET | ORAL | Status: DC | PRN
  Administered 2014-01-20: 650 mg via ORAL
  Filled 2014-01-20: qty 2

## 2014-01-20 MED ORDER — BUTORPHANOL TARTRATE 1 MG/ML IJ SOLN: 1.0000 mg | INTRAMUSCULAR | Status: DC | PRN

## 2014-01-20 MED ORDER — ONDANSETRON HCL 4 MG/2ML IJ SOLN: 4.0000 mg | Freq: Four times a day (QID) | INTRAMUSCULAR | Status: DC | PRN

## 2014-01-20 MED ORDER — LACTATED RINGERS IV SOLN
INTRAVENOUS | Status: DC
Start: 2014-01-20 — End: 2014-01-21
  Administered 2014-01-20 – 2014-01-21 (×4): via INTRAVENOUS

## 2014-01-20 MED ORDER — OXYCODONE-ACETAMINOPHEN 5-325 MG PO TABS
1.0000 | ORAL_TABLET | ORAL | Status: DC | PRN
  Administered 2014-01-21: 2 via ORAL
  Filled 2014-01-20: qty 2

## 2014-01-20 NOTE — Anesthesia Procedure Notes (Signed)
Epidural Patient location during procedure: OB Start time: 01/20/2014 10:25 AM  Staffing Anesthesiologist: Brayton CavesJACKSON, Illeana Edick Performed by: anesthesiologist   Preanesthetic Checklist Completed: patient identified, site marked, surgical consent, pre-op evaluation, timeout performed, IV checked, risks and benefits discussed and monitors and equipment checked  Epidural Patient position: sitting Prep: site prepped and draped and DuraPrep Patient monitoring: continuous pulse ox and blood pressure Approach: midline Location: L3-L4 Injection technique: LOR air  Needle:  Needle type: Tuohy  Needle gauge: 17 G Needle length: 9 cm and 9 Needle insertion depth: 4 cm Catheter type: closed end flexible Catheter size: 19 Gauge Catheter at skin depth: 10 cm Test dose: negative  Assessment Events: blood not aspirated, injection not painful, no injection resistance, negative IV test and no paresthesia  Additional Notes Patient identified.  Risk benefits discussed including failed block, incomplete pain control, headache, nerve damage, paralysis, blood pressure changes, nausea, vomiting, reactions to medication both toxic or allergic, and postpartum back pain.  Patient expressed understanding and wished to proceed.  All questions were answered.  Sterile technique used throughout procedure and epidural site dressed with sterile barrier dressing. No paresthesia noted.  On first pass the patient jumped causing a probable CSF leak at L4-L5.  Moved to L3-L4 and placed epidural without complication. The patient did not experience any signs of intravascular injection such as tinnitus or metallic taste in mouth nor signs of intrathecal spread such as rapid motor block. Please see nursing notes for vital signs.  I discussed the likelihood of a PDPH and discussed treatment options with her.  Her questions were answered.

## 2014-01-20 NOTE — H&P (Signed)
This is Dr. Francoise CeoBernard Wallie Lagrand dictating the history and physical on  Gina Hayden she's a 33 year old gravida 3 para 1011 EDC 01/20/2014 she's a 40 weeks negative GBS desired induction cervix was 2-3 cm 80% vertex -3 amniotomy performed the fluids clear she is now on Pitocin Past medical history negative Past surgical history negative Social history negative System review negative Physical exam well-developed female in labor HEENT negative Lungs scattered P&A Heart regular rhythm no murmurs no gallops Abdomen term Pelvic as described above Extremities negative

## 2014-01-20 NOTE — Anesthesia Preprocedure Evaluation (Signed)

## 2014-01-21 ENCOUNTER — Encounter (HOSPITAL_COMMUNITY): Payer: Medicaid Other | Admitting: Anesthesiology

## 2014-01-21 ENCOUNTER — Inpatient Hospital Stay (HOSPITAL_COMMUNITY): Payer: Medicaid Other | Admitting: Anesthesiology

## 2014-01-21 ENCOUNTER — Encounter (HOSPITAL_COMMUNITY): Payer: Self-pay

## 2014-01-21 MED ORDER — IBUPROFEN 600 MG PO TABS
600.0000 mg | ORAL_TABLET | Freq: Four times a day (QID) | ORAL | Status: DC
  Administered 2014-01-21 – 2014-01-22 (×4): 600 mg via ORAL
  Filled 2014-01-21 (×4): qty 1

## 2014-01-21 MED ORDER — SENNOSIDES-DOCUSATE SODIUM 8.6-50 MG PO TABS
2.0000 | ORAL_TABLET | ORAL | Status: DC
  Administered 2014-01-21: 2 via ORAL
  Filled 2014-01-21 (×3): qty 2

## 2014-01-21 MED ORDER — SIMETHICONE 80 MG PO CHEW
80.0000 mg | CHEWABLE_TABLET | ORAL | Status: DC | PRN
  Filled 2014-01-21: qty 1

## 2014-01-21 MED ORDER — WITCH HAZEL-GLYCERIN EX PADS: 1.0000 "application " | MEDICATED_PAD | CUTANEOUS | Status: DC | PRN

## 2014-01-21 MED ORDER — OXYCODONE-ACETAMINOPHEN 5-325 MG PO TABS
1.0000 | ORAL_TABLET | ORAL | Status: DC | PRN
  Administered 2014-01-21: 1 via ORAL
  Administered 2014-01-21: 2 via ORAL
  Filled 2014-01-21: qty 1
  Filled 2014-01-21: qty 2

## 2014-01-21 MED ORDER — BENZOCAINE-MENTHOL 20-0.5 % EX AERO
1.0000 | INHALATION_SPRAY | CUTANEOUS | Status: DC | PRN
Start: 2014-01-21 — End: 2014-01-22

## 2014-01-21 MED ORDER — ZOLPIDEM TARTRATE 5 MG PO TABS: 5.0000 mg | ORAL_TABLET | Freq: Every evening | ORAL | Status: DC | PRN

## 2014-01-21 MED ORDER — DIPHENHYDRAMINE HCL 25 MG PO CAPS
25.0000 mg | ORAL_CAPSULE | Freq: Four times a day (QID) | ORAL | Status: DC | PRN
  Filled 2014-01-21: qty 1

## 2014-01-21 MED ORDER — FERROUS SULFATE 325 (65 FE) MG PO TABS
325.0000 mg | ORAL_TABLET | Freq: Two times a day (BID) | ORAL | Status: DC
  Administered 2014-01-21 – 2014-01-22 (×2): 325 mg via ORAL
  Filled 2014-01-21 (×2): qty 1

## 2014-01-21 MED ORDER — LANOLIN HYDROUS EX OINT: TOPICAL_OINTMENT | CUTANEOUS | Status: DC | PRN

## 2014-01-21 MED ORDER — ONDANSETRON HCL 4 MG PO TABS: 4.0000 mg | ORAL_TABLET | ORAL | Status: DC | PRN

## 2014-01-21 MED ORDER — TETANUS-DIPHTH-ACELL PERTUSSIS 5-2.5-18.5 LF-MCG/0.5 IM SUSP
0.5000 mL | Freq: Once | INTRAMUSCULAR | Status: AC
  Administered 2014-01-22: 0.5 mL via INTRAMUSCULAR
  Filled 2014-01-21: qty 0.5

## 2014-01-21 MED ORDER — DIBUCAINE 1 % RE OINT: 1.0000 "application " | TOPICAL_OINTMENT | RECTAL | Status: DC | PRN

## 2014-01-21 MED ORDER — ONDANSETRON HCL 4 MG/2ML IJ SOLN
4.0000 mg | INTRAMUSCULAR | Status: DC | PRN
Start: 2014-01-21 — End: 2014-01-22

## 2014-01-21 MED ORDER — PRENATAL MULTIVITAMIN CH
1.0000 | ORAL_TABLET | Freq: Every day | ORAL | Status: DC
  Administered 2014-01-21: 1 via ORAL
  Filled 2014-01-21 (×3): qty 1

## 2014-01-21 NOTE — Anesthesia Preprocedure Evaluation (Signed)
Anesthesia Evaluation  Patient identified by MRN, date of birth, ID band Patient awake    Reviewed: Allergy & Precautions, H&P , NPO status , Patient's Chart, lab work & pertinent test results  History of Anesthesia Complications (+) history of anesthetic complications  Airway Mallampati: II      Dental   Pulmonary neg pulmonary ROS,  breath sounds clear to auscultation        Cardiovascular negative cardio ROS  Rhythm:Regular     Neuro/Psych PDPH with nausea negative psych ROS   GI/Hepatic negative GI ROS, Neg liver ROS,   Endo/Other  negative endocrine ROS  Renal/GU negative Renal ROS     Musculoskeletal negative musculoskeletal ROS (+)   Abdominal   Peds  Hematology negative hematology ROS (+)   Anesthesia Other Findings   Reproductive/Obstetrics                           Anesthesia Physical Anesthesia Plan  ASA: II  Anesthesia Plan: Epidural   Post-op Pain Management:    Induction:   Airway Management Planned:   Additional Equipment:   Intra-op Plan:   Post-operative Plan:   Informed Consent: I have reviewed the patients History and Physical, chart, labs and discussed the procedure including the risks, benefits and alternatives for the proposed anesthesia with the patient or authorized representative who has indicated his/her understanding and acceptance.   Dental advisory given  Plan Discussed with: Anesthesiologist  Anesthesia Plan Comments: (Epidural blood patch)        Anesthesia Quick Evaluation

## 2014-01-21 NOTE — Progress Notes (Signed)
Pt vomiting again.  Every time pt has even slight change in position she states that she feels dizzy and then vomits.  Pt reports that HA is still the same after pain meds.

## 2014-01-21 NOTE — Lactation Note (Signed)
This note was copied from the chart of Gina Hayden. Lactation Consultation Note  Patient Name: Gina Hayden NWGNF'AToday's Date: 01/21/2014 Reason for consult: Other (Comment) (charting for exclusion)   Maternal Data Formula Feeding for Exclusion: Yes Reason for exclusion: Mother's choice to formula feed on admision  Feeding Nipple Type: Regular  LATCH Score/Interventions                      Lactation Tools Discussed/Used     Consult Status Consult Status: Complete    Lynda RainwaterBryant, [name] 01/21/2014, 5:28 PM

## 2014-01-21 NOTE — Progress Notes (Signed)
Dr Maple Hudsonmoser at bedside - discussed blood patch with pt

## 2014-01-21 NOTE — Anesthesia Procedure Notes (Signed)
Epidural Patient location during procedure: post-op Start time: 01/21/2014 9:46 AM End time: 01/21/2014 10:15 AM  Staffing Anesthesiologist: Leshawn Houseworth, CHRIS Performed by: anesthesiologist   Preanesthetic Checklist Completed: patient identified, surgical consent, pre-op evaluation, timeout performed, IV checked, risks and benefits discussed and monitors and equipment checked  Epidural Patient position: sitting Prep: site prepped and draped and DuraPrep Patient monitoring: heart rate, cardiac monitor, continuous pulse ox and blood pressure Approach: midline Location: L4-L5 Injection technique: LOR saline  Needle:  Needle type: Tuohy  Needle gauge: 17 G Needle length: 9 cm Needle insertion depth: 7 cm  Assessment Events: blood not aspirated, injection not painful, no injection resistance, negative IV test and no paresthesia  Additional Notes H+P and labs checked, risks and benefits discussed with the patient, consent obtained, procedure tolerated well and without complications. 20ml autologous blood (from left hand new stick) injected slowly through touhy with injection stopped upon description of back pain by patient. Patient placed in supine position post procedure Reason for block:Epidural Blood Patch

## 2014-01-22 LAB — CBC
HEMATOCRIT: 32.1 % — AB (ref 36.0–46.0)
Hemoglobin: 10.4 g/dL — ABNORMAL LOW (ref 12.0–15.0)
MCH: 28.6 pg (ref 26.0–34.0)
MCHC: 32.4 g/dL (ref 30.0–36.0)
MCV: 88.2 fL (ref 78.0–100.0)
Platelets: 176 10*3/uL (ref 150–400)
RBC: 3.64 MIL/uL — ABNORMAL LOW (ref 3.87–5.11)
RDW: 14.5 % (ref 11.5–15.5)
WBC: 11.3 10*3/uL — ABNORMAL HIGH (ref 4.0–10.5)

## 2014-01-22 NOTE — Discharge Instructions (Signed)
Discharge instructions   You can wash your hair  Shower  Eat what you want  Drink what you want  See me in 6 weeks  Your ankles are going to swell more in the next 2 weeks than when pregnant  No sex for 6 weeks   Brealyn Baril A, MD 01/22/2014

## 2014-01-22 NOTE — Discharge Summary (Signed)
Obstetric Discharge Summary Reason for Admission: induction of labor Prenatal Procedures: none Intrapartum Procedures: spontaneous vaginal delivery Postpartum Procedures: none Complications-Operative and Postpartum: none Hemoglobin  Date Value Ref Range Status  01/20/2014 11.9* 12.0 - 15.0 g/dL Final     HCT  Date Value Ref Range Status  01/20/2014 36.9  36.0 - 46.0 % Final    Physical Exam:  General: alert Lochia: appropriate Uterine Fundus: firm Incision: healing well DVT Evaluation: No evidence of DVT seen on physical exam.  Discharge Diagnoses: Term Pregnancy-delivered  Discharge Information: Date: 01/22/2014 Activity: pelvic rest Diet: routine Medications: Percocet Condition: stable Instructions: refer to practice specific booklet Discharge to: home Follow-up Information   Follow up with Kathreen CosierMARSHALL,BERNARD A, MD.   Specialty:  Obstetrics and Gynecology   Contact information:   [address] [place] [address] [phone]       Newborn Data: Live born female  Birth Weight: 7 lb 5.1 oz (3320 g) APGAR: 8, 9  Home with mother.  MARSHALL,BERNARD A 01/22/2014, 6:06 AM

## 2014-01-22 NOTE — Anesthesia Postprocedure Evaluation (Signed)
  Anesthesia Post-op Note  Patient: Gina Hayden  Procedure(s) Performed: * No procedures listed *  Patient Location: PACU  Anesthesia Type:none  Level of Consciousness: sleeping  Airway and Oxygen Therapy: Patient Spontanous Breathing  Post-op Pain: mild back pain, headache improved  Post-op Assessment: Post-op Vital signs reviewed, Patient's Cardiovascular Status Stable, Respiratory Function Stable, Patent Airway, No signs of Nausea or vomiting and Pain level controlled  Post-op Vital Signs: Reviewed  Last Vitals: There were no vitals filed for this visit.  Complications: No apparent anesthesia complications

## 2014-01-22 NOTE — Progress Notes (Signed)
Patient ID: Gina Hayden, female   DOB: 04-Feb-1981, [age] y.o.   MRN: [id number] Postpartum day one Vital signs normal Fundus firm Lochia moderate patient wants and a discharge today

## 2014-01-29 NOTE — Anesthesia Postprocedure Evaluation (Signed)
  Anesthesia Post Note  Patient: Gina Hayden  Procedure(s) Performed: * No procedures listed *  Anesthesia type: Epidural  Patient location: PACU  Post pain: Pain level controlled  Post assessment: Post-op Vital signs reviewed  Last Vitals: There were no vitals filed for this visit.  Post vital signs: Reviewed  Level of consciousness: awake  Complications: Post dural puncture headache.  See other note for details and blood patch

## 2014-05-22 ENCOUNTER — Encounter (HOSPITAL_COMMUNITY): Payer: Self-pay

## 2015-10-18 ENCOUNTER — Encounter (HOSPITAL_COMMUNITY): Payer: Self-pay

## 2015-10-18 ENCOUNTER — Emergency Department (HOSPITAL_COMMUNITY)
Admission: EM | Admit: 2015-10-18 | Discharge: 2015-10-18 | Disposition: A | Discharge status: Home / Self Care | Payer: BLUE CROSS/BLUE SHIELD | Attending: Emergency Medicine | Admitting: Emergency Medicine

## 2015-10-18 ENCOUNTER — Emergency Department (HOSPITAL_COMMUNITY): Payer: BLUE CROSS/BLUE SHIELD

## 2015-10-18 ENCOUNTER — Other Ambulatory Visit: Payer: Self-pay

## 2015-10-18 DIAGNOSIS — R079 Chest pain, unspecified: Secondary | ICD-10-CM | POA: Diagnosis not present

## 2015-10-18 LAB — CBC
HEMATOCRIT: 40.7 % (ref 36.0–46.0)
HEMOGLOBIN: 13.2 g/dL (ref 12.0–15.0)
MCH: 27.7 pg (ref 26.0–34.0)
MCHC: 32.4 g/dL (ref 30.0–36.0)
MCV: 85.3 fL (ref 78.0–100.0)
Platelets: 340 10*3/uL (ref 150–400)
RBC: 4.77 MIL/uL (ref 3.87–5.11)
RDW: 12.3 % (ref 11.5–15.5)
WBC: 7.6 10*3/uL (ref 4.0–10.5)

## 2015-10-18 LAB — BASIC METABOLIC PANEL
ANION GAP: 9 (ref 5–15)
BUN: 8 mg/dL (ref 6–20)
CHLORIDE: 103 mmol/L (ref 101–111)
CO2: 26 mmol/L (ref 22–32)
Calcium: 10.1 mg/dL (ref 8.9–10.3)
Creatinine, Ser: 0.75 mg/dL (ref 0.44–1.00)
GFR calc non Af Amer: >60 mL/min (ref >60)
Glucose, Bld: 114 mg/dL — ABNORMAL HIGH (ref 65–99)
POTASSIUM: 3.6 mmol/L (ref 3.5–5.1)
Sodium: 138 mmol/L (ref 135–145)

## 2015-10-18 LAB — I-STAT TROPONIN, ED: Troponin i, poc: 0 ng/mL (ref 0.00–0.08)

## 2015-10-18 LAB — D-DIMER, QUANTITATIVE: D-Dimer, Quant: <0.27 ug/mL-FEU (ref 0.00–0.50)

## 2015-10-18 NOTE — ED Notes (Signed)
Patient alert and oriented at discharge.  Patient vital signs stable at discharge.

## 2015-10-18 NOTE — ED Notes (Signed)
Patient here with 2 days of SSCP with intermittent shooting pain to same, reports pain worse in supine position. Denies cold symptoms. Non-smoker

## 2015-10-18 NOTE — Discharge Instructions (Signed)
Take Tylenol every 4 hours for pain. Use heat on the sore area 3 or 4 times a day. Return here, if needed, for problems.   Nonspecific Chest Pain  Chest pain can be caused by many different conditions. There is always a chance that your pain could be related to something serious, such as a heart attack or a blood clot in your lungs. Chest pain can also be caused by conditions that are not life-threatening. If you have chest pain, it is very important to follow up with your health care provider. CAUSES  Chest pain can be caused by:  Heartburn.  Pneumonia or bronchitis.  Anxiety or stress.  Inflammation around your heart (pericarditis) or lung (pleuritis or pleurisy).  A blood clot in your lung.  A collapsed lung (pneumothorax). It can develop suddenly on its own (spontaneous pneumothorax) or from trauma to the chest.  Shingles infection (varicella-zoster virus).  Heart attack.  Damage to the bones, muscles, and cartilage that make up your chest wall. This can include:  Bruised bones due to injury.  Strained muscles or cartilage due to frequent or repeated coughing or overwork.  Fracture to one or more ribs.  Sore cartilage due to inflammation (costochondritis). RISK FACTORS  Risk factors for chest pain may include:  Activities that increase your risk for trauma or injury to your chest.  Respiratory infections or conditions that cause frequent coughing.  Medical conditions or overeating that can cause heartburn.  Heart disease or family history of heart disease.  Conditions or health behaviors that increase your risk of developing a blood clot.  Having had chicken pox (varicella zoster). SIGNS AND SYMPTOMS Chest pain can feel like:  Burning or tingling on the surface of your chest or deep in your chest.  Crushing, pressure, aching, or squeezing pain.  Dull or sharp pain that is worse when you move, cough, or take a deep breath.  Pain that is also felt in your  back, neck, shoulder, or arm, or pain that spreads to any of these areas. Your chest pain may come and go, or it may stay constant. DIAGNOSIS Lab tests or other studies may be needed to find the cause of your pain. Your health care provider may have you take a test called an ambulatory ECG (electrocardiogram). An ECG records your heartbeat patterns at the time the test is performed. You may also have other tests, such as:  Transthoracic echocardiogram (TTE). During echocardiography, sound waves are used to create a picture of all of the heart structures and to look at how blood flows through your heart.  Transesophageal echocardiogram (TEE).This is a more advanced imaging test that obtains images from inside your body. It allows your health care provider to see your heart in finer detail.  Cardiac monitoring. This allows your health care provider to monitor your heart rate and rhythm in real time.  Holter monitor. This is a portable device that records your heartbeat and can help to diagnose abnormal heartbeats. It allows your health care provider to track your heart activity for several days, if needed.  Stress tests. These can be done through exercise or by taking medicine that makes your heart beat more quickly.  Blood tests.  Imaging tests. TREATMENT  Your treatment depends on what is causing your chest pain. Treatment may include:  Medicines. These may include:  Acid blockers for heartburn.  Anti-inflammatory medicine.  Pain medicine for inflammatory conditions.  Antibiotic medicine, if an infection is present.  Medicines to dissolve  blood clots.  Medicines to treat coronary artery disease.  Supportive care for conditions that do not require medicines. This may include:  Resting.  Applying heat or cold packs to injured areas.  Limiting activities until pain decreases. HOME CARE INSTRUCTIONS  If you were prescribed an antibiotic medicine, finish it all even if you  start to feel better.  Avoid any activities that bring on chest pain.  Do not use any tobacco products, including cigarettes, chewing tobacco, or electronic cigarettes. If you need help quitting, ask your health care provider.  Do not drink alcohol.  Take medicines only as directed by your health care provider.  Keep all follow-up visits as directed by your health care provider. This is important. This includes any further testing if your chest pain does not go away.  If heartburn is the cause for your chest pain, you may be told to keep your head raised (elevated) while sleeping. This reduces the chance that acid will go from your stomach into your esophagus.  Make lifestyle changes as directed by your health care provider. These may include:  Getting regular exercise. Ask your health care provider to suggest some activities that are safe for you.  Eating a heart-healthy diet. A registered dietitian can help you to learn healthy eating options.  Maintaining a healthy weight.  Managing diabetes, if necessary.  Reducing stress. SEEK MEDICAL CARE IF:  Your chest pain does not go away after treatment.  You have a rash with blisters on your chest.  You have a fever. SEEK IMMEDIATE MEDICAL CARE IF:   Your chest pain is worse.  You have an increasing cough, or you cough up blood.  You have severe abdominal pain.  You have severe weakness.  You faint.  You have chills.  You have sudden, unexplained chest discomfort.  You have sudden, unexplained discomfort in your arms, back, neck, or jaw.  You have shortness of breath at any time.  You suddenly start to sweat, or your skin gets clammy.  You feel nauseous or you vomit.  You suddenly feel light-headed or dizzy.  Your heart begins to beat quickly, or it feels like it is skipping beats. These symptoms may represent a serious problem that is an emergency. Do not wait to see if the symptoms will go away. Get medical  help right away. Call your local emergency services (911 in the [place] Do not drive yourself to the hospital.   This information is not intended to replace advice given to you by your health care provider. Make sure you discuss any questions you have with your health care provider.   Document Released: 04/16/2005 Document Revised: 07/28/2014 Document Reviewed: 02/10/2014 Elsevier Interactive Patient Education [employer]2016 [employer] Inc.

## 2015-10-18 NOTE — ED Provider Notes (Signed)
CSN: [id number]     Arrival date & time 10/18/15  7829 History   First MD Initiated Contact with Patient 10/18/15 1225     Chief Complaint  Patient presents with  . Chest Pain     (Consider location/radiation/quality/duration/timing/severity/associated sxs/prior Treatment) HPI   Gina Hayden is a 35 y.o. female who presents for evaluation of central chest pressure, present for 2 days, persistent, with an occasional shooting pain in her left lateral chest. No associated shortness of breath, nausea, vomiting, diaphoresis, weakness or dizziness. No prior similar problem. She has an Implanon contraception implant, and is not on estrogen. No prior similar problem. There are no other known modifying factors.  Past Medical History  Diagnosis Date  . No pertinent past medical history    Past Surgical History  Procedure Laterality Date  . Wisdom tooth extraction    . Dilation and curettage of uterus      abortion   Family History  Problem Relation Age of Onset  . Hypertension Mother   . Hyperlipidemia Mother   . Hypertension Sister    Social History  Substance Use Topics  . Smoking status: Never Smoker   . Smokeless tobacco: Never Used  . Alcohol Use: Yes     Comment: social alcohol before pregnancy   OB History    Gravida Para Term Preterm AB TAB SAB Ectopic Multiple Living   Review of Systems  All other systems reviewed and are negative.     Allergies  Review of patient's allergies indicates no known allergies.  Home Medications   Prior to Admission medications   Not on File   BP 137/105 mmHg  Pulse 120  Temp(Src) 99.5 F (37.5 C)  Resp 20  Ht  (1.6 m)  Wt 153 lb (69.4 kg)  BMI 27.11 kg/m2  SpO2 99% Physical Exam  Constitutional: She is oriented to person, place, and time. She appears well-developed and well-nourished.  HENT:  Head: Normocephalic and atraumatic.  Right Ear: External ear normal.  Left Ear: External ear  normal.  Eyes: Conjunctivae and EOM are normal. Pupils are equal, round, and reactive to light.  Neck: Normal range of motion and phonation normal. Neck supple.  Cardiovascular: Normal rate, regular rhythm and normal heart sounds.   Pulmonary/Chest: Effort normal and breath sounds normal. No respiratory distress. She has no wheezes. She exhibits no tenderness and no bony tenderness.  Abdominal: Soft. There is no tenderness.  Musculoskeletal: Normal range of motion.  Neurological: She is alert and oriented to person, place, and time. No cranial nerve deficit or sensory deficit. She exhibits normal muscle tone. Coordination normal.  Skin: Skin is warm, dry and intact.  Psychiatric: She has a normal mood and affect. Her behavior is normal. Judgment and thought content normal.  Nursing note and vitals reviewed.   ED Course  Procedures (including critical care time)  Initial clinical impression- nonspecific pain. She will be evaluated with imaging, and blood work.   Medications - No data to display  Patient Vitals for the past 24 hrs:  BP Temp Pulse Resp SpO2 Height Weight  10/18/15 0947 (!) 137/105 mmHg 99.5 F (37.5 C) 120 20 99 %  (1.6 m) 153 lb (69.4 kg)    1:35 PM Reevaluation with update and discussion. After initial assessment and treatment, an updated evaluation reveals No additional complaints. Findings discussed with the patient, all questions were answered. Acel Natzke  L    Labs Review Labs Reviewed  BASIC METABOLIC PANEL - Abnormal; Notable for the following:    Glucose, Bld 114 (*)    All other components within normal limits  CBC  D-DIMER, QUANTITATIVE (NOT AT [facility])  Rosezena SensorI-STAT TROPOININ, ED    Imaging Review Dg Chest 2 View  10/18/2015  CLINICAL DATA:  35 year old female with 2 days of chest pain in the sternum and under the left breast. Initial encounter. EXAM: CHEST  2 VIEW COMPARISON:  Chest and abdominal series 8308. FINDINGS: Normal lung volumes.  Mediastinal contours remain normal. Visualized tracheal air column is within normal limits. The lungs are clear. No pneumothorax or effusion. Incidental EKG button artifact on the left. No pneumoperitoneum and negative visible bowel gas pattern. Mild dextro convex curvature of the thoracic spine has not significantly changed. No acute osseous abnormality identified. IMPRESSION: Negative, no acute cardiopulmonary abnormality. Electronically Signed   By: Odessa FlemingH  Hall M.D.   On: 10/18/2015 10:50   I have personally reviewed and evaluated these images and lab results as part of my medical decision-making.   EKG Interpretation None      MDM   Final diagnoses:  Nonspecific chest pain    Nonspecific chest pain, low risk cardiac profile. Doubt ACS, PE or pneumonia.  Nursing Notes Reviewed/ Care Coordinated Applicable Imaging Reviewed Interpretation of Laboratory Data incorporated into ED treatment  The patient appears reasonably screened and/or stabilized for discharge and I doubt any other medical condition or other [facility] requiring further screening, evaluation, or treatment in the ED at this time prior to discharge.  Plan: Home Medications- APAP; Home Treatments- heat; return here if the recommended treatment, does not improve the symptoms; Recommended follow up- PCP prn     Mancel BaleElliott Aliyyah Riese, MD 10/18/15 [phone]

## 2016-02-25 ENCOUNTER — Other Ambulatory Visit: Payer: Self-pay | Admitting: Family Medicine

## 2016-02-25 DIAGNOSIS — N644 Mastodynia: Secondary | ICD-10-CM

## 2016-02-25 DIAGNOSIS — N63 Unspecified lump in unspecified breast: Secondary | ICD-10-CM

## 2016-02-29 ENCOUNTER — Other Ambulatory Visit: Payer: Self-pay

## 2016-02-29 ENCOUNTER — Other Ambulatory Visit: Payer: Self-pay | Admitting: Advanced Practice Midwife

## 2016-02-29 ENCOUNTER — Other Ambulatory Visit: Payer: Self-pay | Admitting: Family Medicine

## 2016-02-29 DIAGNOSIS — N63 Unspecified lump in unspecified breast: Secondary | ICD-10-CM

## 2016-03-04 ENCOUNTER — Ambulatory Visit
Admission: RE | Admit: 2016-03-04 | Discharge: 2016-03-04 | Disposition: A | Discharge status: Home / Self Care | Payer: BLUE CROSS/BLUE SHIELD | Source: Ambulatory Visit | Attending: Family Medicine | Admitting: Family Medicine

## 2016-03-04 DIAGNOSIS — N63 Unspecified lump in unspecified breast: Secondary | ICD-10-CM

## 2016-03-04 DIAGNOSIS — N644 Mastodynia: Secondary | ICD-10-CM

## 2016-08-13 ENCOUNTER — Ambulatory Visit: Payer: Self-pay | Admitting: Neurology

## 2016-08-18 ENCOUNTER — Ambulatory Visit: Payer: Self-pay | Admitting: Neurology

## 2016-09-02 ENCOUNTER — Ambulatory Visit (INDEPENDENT_AMBULATORY_CARE_PROVIDER_SITE_OTHER): Payer: Self-pay | Admitting: Neurology

## 2016-09-02 ENCOUNTER — Encounter: Payer: Self-pay | Admitting: Neurology

## 2016-09-02 VITALS — BP 118/80 | HR 94 | Ht 61.0 in | Wt 151.4 lb

## 2016-09-02 DIAGNOSIS — R200 Anesthesia of skin: Secondary | ICD-10-CM

## 2016-09-02 DIAGNOSIS — R202 Paresthesia of skin: Secondary | ICD-10-CM

## 2016-09-02 DIAGNOSIS — R209 Unspecified disturbances of skin sensation: Secondary | ICD-10-CM

## 2016-09-02 NOTE — Progress Notes (Signed)
[facility] Neurology Division Clinic Note - Initial Visit   Date: 09/02/16  [name] MRN: [id number] DOB: [birth year]   Dear Gina Lucks, NP:  Thank you for your kind referral of [name] for consultation of paresthesias. Although her history is well known to you, please allow [place] to reiterate it for the purpose of our medical record. The patient was accompanied to the clinic by self.   History of Present Illness: Gina Hayden is a 36 y.o. right-handed African American female with no prior medical history presenting for evaluation of numbness and tingling of the hands and feet.    Starting around October 2017, she developed constant tingling of the tips of the fingers and toes bilaterally.  Symptoms have improved by rest or if she raises are arms. She feels it may be a circulation issue.   She sometimes has pain in the fingers.   She denies any imbalance, weakness, or falls.  No associated neck or back pain.  Around late January, she noticed intermittent numbness of the lips, lasting a few hours.  She tries to massage her lips, but it does not help.  She had no identified any triggers or alleviating factors.   No prior history of similar spells, headaches, or vision loss.   She went to see her PCP who checked TSH and vitamin B12 which was normal.   Out-side paper records, electronic medical record, and images have been reviewed where available and summarized as:  Labs 05/23/2016:  Vitamin B12 687, TSH 0.74, CMP nml   Past Medical History:  Diagnosis Date  . No pertinent past medical history     Past Surgical History:  Procedure Laterality Date  . DILATION AND CURETTAGE OF UTERUS     abortion  . WISDOM TOOTH EXTRACTION       Medications:  Outpatient Encounter Prescriptions as of 09/02/2016  Medication Sig  . Calcium Carbonate-Vit D-Min (CALCIUM 1200 PO) Take by mouth.  . cyanocobalamin 1000 MCG tablet Take 1,000 mcg by mouth daily.  . Multiple  Vitamin (MULTIVITAMIN) tablet Take 1 tablet by mouth daily.   No facility-administered encounter medications on file as of 09/02/2016.      Allergies: No Known Allergies  Family History: Family History  Problem Relation Age of Onset  . Hypertension Mother   . Hyperlipidemia Mother   . Hypertension Sister   . Hyperlipidemia Sister   . Stroke Father   . Stroke Maternal Grandmother   . Diabetes Mellitus I Paternal Grandmother     Social History: Social History  Substance Use Topics  . Smoking status: Never Smoker  . Smokeless tobacco: Never Used  . Alcohol use Yes     Comment: Socially   Social History   Social History Narrative   Lives with husband and 2 sons in a 2 story home.     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     Education: college.     Review of Systems:  CONSTITUTIONAL: No fevers, chills, night sweats, or weight loss.   EYES: No visual changes or eye pain ENT: No hearing changes.  No history of nose bleeds.   RESPIRATORY: No cough, wheezing and shortness of breath.   CARDIOVASCULAR: Negative for chest pain, and palpitations.   GI: Negative for abdominal discomfort, blood in stools or black stools.  No recent change in bowel habits.   GU:  No history of incontinence.   MUSCLOSKELETAL: No history of joint pain or swelling.  No myalgias.  SKIN: Negative for lesions, rash, and itching.   HEMATOLOGY/ONCOLOGY: Negative for prolonged bleeding, bruising easily, and swollen nodes.  No history of cancer.   ENDOCRINE: Negative for cold or heat intolerance, polydipsia or goiter.   PSYCH:  No depression or anxiety symptoms.   NEURO: As Above.   Vital Signs:  BP 118/80   Pulse 94   Ht 5\' 1"  (1.549 m)   Wt 151 lb 6 oz (68.7 kg)   SpO2 99%   BMI 28.60 kg/m    General Medical Exam:   General:  Well appearing, comfortable.   Eyes/ENT: see cranial nerve examination.   Neck: No masses appreciated.  Full range of motion without tenderness.  No carotid  bruits. Respiratory:  Clear to auscultation, good air entry bilaterally.   Cardiac:  Regular rate and rhythm, no murmur.   Extremities:  No deformities, edema, or skin discoloration. Radial and pedal pulses are intact.  Skin:  No rashes or lesions.  Neurological Exam: MENTAL STATUS including orientation to time, place, person, recent and remote memory, attention span and concentration, language, and fund of knowledge is normal.  Speech is not dysarthric.  CRANIAL NERVES: II:  No visual field defects.  Unremarkable fundi.   III-IV-VI: Pupils equal round and reactive to light.  Normal conjugate, extra-ocular eye movements in all directions of gaze.  No nystagmus.  No ptosis.   V:  Normal facial sensation.     VII:  Normal facial symmetry and movements.  VIII:  Normal hearing and vestibular function.   IX-X:  Normal palatal movement.   XI:  Normal shoulder shrug and head rotation.   XII:  Normal tongue strength and range of motion, no deviation or fasciculation.  MOTOR:  No atrophy, fasciculations or abnormal movements.  No pronator drift.  Tone is normal.    Right Upper Extremity:    Left Upper Extremity:    Deltoid  5/5   Deltoid  5/5   Biceps  5/5   Biceps  5/5   Triceps  5/5   Triceps  5/5   Wrist extensors  5/5   Wrist extensors  5/5   Wrist flexors  5/5   Wrist flexors  5/5   Finger extensors  5/5   Finger extensors  5/5   Finger flexors  5/5   Finger flexors  5/5   Dorsal interossei  5/5   Dorsal interossei  5/5   Abductor pollicis  5/5   Abductor pollicis  5/5   Tone (Ashworth scale)  0  Tone (Ashworth scale)  0   Right Lower Extremity:    Left Lower Extremity:    Hip flexors  5/5   Hip flexors  5/5   Hip extensors  5/5   Hip extensors  5/5   Knee flexors  5/5   Knee flexors  5/5   Knee extensors  5/5   Knee extensors  5/5   Dorsiflexors  5/5   Dorsiflexors  5/5   Plantarflexors  5/5   Plantarflexors  5/5   Toe extensors  5/5   Toe extensors  5/5   Toe flexors  5/5   Toe  flexors  5/5   Tone (Ashworth scale)  0  Tone (Ashworth scale)  0   MSRs:  Right  Left brachioradialis 2+  brachioradialis 2+  biceps 2+  biceps 2+  triceps 2+  triceps 2+  patellar 2+  patellar 2+  ankle jerk 1+  ankle jerk 1+  Hoffman no  Hoffman no  plantar response down  plantar response down   SENSORY:  Patient reports diminished sensation to pin prick and temperature on the left arm and leg by 50%.    Romberg's sign absent.   COORDINATION/GAIT: Normal finger-to- nose-finger.  Intact rapid alternating movements bilaterally.  Able to rise from a chair without using arms.  Gait narrow based and stable. Tandem and stressed gait intact.    IMPRESSION: Mrs. Jon Gills is a 36 year-old female referred for evaluation of paresthesias involving the lips, hands, and feet.  Her exam objectively does not disclose focal abnormalities, however, subjectively, she reports reduced perception of pin prick and temperature on the left side.  She will have MRI brian wwo contrast to look for demyelinating disease. 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  This would not explain her current sensory complaints.  I do not see evidence of poor circulation, which she was questioning.  Her TSH and vitamin B12 was normal.  If her MRI brain does not explain her symptoms, the next step will be NCS/EMG of the arm and leg.   The duration of this appointment visit was 35 minutes of face-to-face time with the patient.  Greater than 50% of this time was spent in counseling, explanation of diagnosis, planning of further management, and coordination of care.   Thank you for allowing me to participate in patient's care.  If I can answer any additional questions, I would be pleased to do so.     Sincerely,    Keyaira Clapham K. Allena Katz, DO

## 2016-09-02 NOTE — Patient Instructions (Addendum)
1.  MRI brain wwo contrast 2.  We will call you with the results of your testing

## 2016-11-07 ENCOUNTER — Ambulatory Visit: Payer: Self-pay | Admitting: Neurology

## 2017-05-17 ENCOUNTER — Emergency Department (HOSPITAL_COMMUNITY): Payer: Self-pay

## 2017-05-17 ENCOUNTER — Encounter (HOSPITAL_COMMUNITY): Payer: Self-pay

## 2017-05-17 ENCOUNTER — Emergency Department (HOSPITAL_COMMUNITY)
Admission: EM | Admit: 2017-05-17 | Discharge: 2017-05-17 | Disposition: A | Discharge status: Home / Self Care | Payer: Self-pay | Attending: Emergency Medicine | Admitting: Emergency Medicine

## 2017-05-17 DIAGNOSIS — R102 Pelvic and perineal pain: Secondary | ICD-10-CM | POA: Insufficient documentation

## 2017-05-17 LAB — WET PREP, GENITAL
Sperm: NONE SEEN
TRICH WET PREP: NONE SEEN
YEAST WET PREP: NONE SEEN

## 2017-05-17 LAB — URINALYSIS, ROUTINE W REFLEX MICROSCOPIC
Bacteria, UA: NONE SEEN
Bilirubin Urine: NEGATIVE
Glucose, UA: NEGATIVE mg/dL
Ketones, ur: NEGATIVE mg/dL
LEUKOCYTES UA: NEGATIVE
Nitrite: NEGATIVE
Protein, ur: NEGATIVE mg/dL
SPECIFIC GRAVITY, URINE: 1.019 (ref 1.005–1.030)
pH: 5 (ref 5.0–8.0)

## 2017-05-17 LAB — POC URINE PREG, ED: PREG TEST UR: NEGATIVE

## 2017-05-17 MED ORDER — IBUPROFEN 800 MG PO TABS
800.0000 mg | ORAL_TABLET | Freq: Once | ORAL | Status: AC
  Administered 2017-05-17: 800 mg via ORAL
  Filled 2017-05-17: qty 1

## 2017-05-17 MED ORDER — TRAMADOL HCL 50 MG PO TABS: 50.0000 mg | ORAL_TABLET | Freq: Four times a day (QID) | ORAL | 0 refills | Status: DC | PRN

## 2017-05-17 NOTE — ED Provider Notes (Signed)
[facility] EMERGENCY DEPARTMENT Provider Note   CSN: [id number] Arrival date & time: 05/17/17  4098     History   Chief Complaint Chief Complaint  Patient presents with  . Pelvic Pain    HPI Gina Hayden is a 36 y.o. female.  The history is provided by the patient.  Pelvic Pain   She has had some lower abdominal cramping intermittently over the last week.  Starting yesterday, she had some severe pain in the left suprapubic area.  She rates pain at 7/10.  Pain is worse with movement.  She has taken acetaminophen and used heating pad with slight, temporary relief.  She denies vaginal discharge.  She denies nausea or vomiting.  She denies dysuria.  She denies fever or chills.  She is on birth control pills, but states that her menses are 5 days late.  She did have strep in the last month, and was on penicillin for the strep.  Past Medical History:  Diagnosis Date  . No pertinent past medical history     Patient Active Problem List   Diagnosis Date Noted  . NVD (normal vaginal delivery) 01/21/2014  . Pregnancy 01/20/2014  . Normal delivery 01/16/2013  . Normal IUP (intrauterine pregnancy) on prenatal ultrasound 06/11/2012  . Corpus luteum cyst 06/11/2012    Past Surgical History:  Procedure Laterality Date  . DILATION AND CURETTAGE OF UTERUS     abortion  . WISDOM TOOTH EXTRACTION      OB History    Gravida Para Term Preterm AB Living   3 2 2   1 2    SAB TAB Ectopic Multiple Live Births     1     2       Home Medications    Prior to Admission medications   Medication Sig Start Date End Date Taking? Authorizing Provider  Calcium Carbonate-Vit D-Min (CALCIUM 1200 PO) Take by mouth.    [provider]  cyanocobalamin 1000 MCG tablet Take 1,000 mcg by mouth daily.    [provider]  Multiple Vitamin (MULTIVITAMIN) tablet Take 1 tablet by mouth daily.    [provider]    Family History Family History    Problem Relation Age of Onset  . Hypertension Mother   . Hyperlipidemia Mother   . Hypertension Sister   . Hyperlipidemia Sister   . Stroke Father   . Stroke Maternal Grandmother   . Diabetes Mellitus I Paternal Grandmother     Social History Social History  Substance Use Topics  . Smoking status: Never Smoker  . Smokeless tobacco: Never Used  . Alcohol use Yes     Comment: Socially     Allergies   Patient has no known allergies.   Review of Systems Review of Systems  Genitourinary: Positive for pelvic pain.  All other systems reviewed and are negative.    Physical Exam Updated Vital Signs BP (!) 151/106 (BP Location: Left Arm)   Pulse (!) 109   Temp 98.5 F (36.9 C) (Oral)   Resp 18   SpO2 100%   Physical Exam  Nursing note and vitals reviewed.  36 year old female, resting comfortably and in no acute distress. Vital signs are significant for hypertension and tachycardia. Oxygen saturation is 100%, which is normal. Head is normocephalic and atraumatic. PERRLA, EOMI. Oropharynx is clear. Neck is nontender and supple without adenopathy or JVD. Back is nontender and there is no CVA tenderness. Lungs are clear without rales, wheezes,  or rhonchi. Chest is nontender. Heart has regular rate and rhythm without murmur. Abdomen is soft, flat, with mild to moderate tenderness in the left suprapubic area.  There are no masses or hepatosplenomegaly and peristalsis is normoactive. Pelvic: Normal external female genitalia.  Cervix is closed.  No bleeding.  Small amount of white vaginal discharge present.  Fundus is normal size and position.  No cervical motion tenderness.  There is moderate bilateral adnexal tenderness without any definite adnexal mass. Extremities have no cyanosis or edema, full range of motion is present. Skin is warm and dry without rash. Neurologic: Mental status is normal, cranial nerves are intact, there are no motor or sensory deficits.  ED  Treatments / Results  Labs (all labs ordered are listed, but only abnormal results are displayed) Labs Reviewed  WET PREP, GENITAL - Abnormal; Notable for the following:       Result Value   Clue Cells Wet Prep HPF POC PRESENT (*)    WBC, Wet Prep HPF POC MANY (*)    All other components within normal limits  URINALYSIS, ROUTINE W REFLEX MICROSCOPIC - Abnormal; Notable for the following:    APPearance HAZY (*)    Hgb urine dipstick SMALL (*)    Squamous Epithelial / LPF 6-30 (*)    All other components within normal limits  RPR  HIV ANTIBODY (ROUTINE TESTING)  POC URINE PREG, ED  GC/CHLAMYDIA PROBE AMP ([place]) NOT AT [facility]   Radiology US Pelvis Transvanginal Non-ob (tv Only)  Result Date: 05/17/2017 CLINICAL DATA:  Pelvic pain since yesterday. EXAM: TRANSABDOMINAL AND TRANSVAGINAL ULTRASOUND OF PELVIS TECHNIQUE: Both transabdominal and transvaginal ultrasound examinations of the pelvis were performed. Transabdominal technique was performed for global imaging of the pelvis including uterus, ovaries, adnexal regions, and pelvic cul-de-sac. It was necessary to proceed with endovaginal exam following the transabdominal exam to visualize the endometrial complex and adnexal structures. COMPARISON:  None FINDINGS: Uterus Measurements: 8.2 x 3.6 x 4.8 cm. No fibroids or other mass visualized. Endometrium Thickness: Normal at 4 mm. No mass or fluid identified within the endometrial canal. Right ovary Measurements: 3.4 x 1.4 x 1.8 cm. Normal appearance. Multiple small follicles. No mass or free fluid within the adjacent right adnexa. Left ovary Measurements: 2.2 x 1.1 x 1.8 cm. Normal appearance. Several small follicles. No mass or free fluid within the adjacent left adnexa. Other findings No abnormal free fluid. IMPRESSION: Normal pelvic ultrasound. Electronically Signed   By: Bary Richard M.D.   On: 05/17/2017 10:44   US Pelvis Complete  Result Date: 05/17/2017 CLINICAL DATA:  Pelvic pain  since yesterday. EXAM: TRANSABDOMINAL AND TRANSVAGINAL ULTRASOUND OF PELVIS TECHNIQUE: Both transabdominal and transvaginal ultrasound examinations of the pelvis were performed. Transabdominal technique was performed for global imaging of the pelvis including uterus, ovaries, adnexal regions, and pelvic cul-de-sac. It was necessary to proceed with endovaginal exam following the transabdominal exam to visualize the endometrial complex and adnexal structures. COMPARISON:  None FINDINGS: Uterus Measurements: 8.2 x 3.6 x 4.8 cm. No fibroids or other mass visualized. Endometrium Thickness: Normal at 4 mm. No mass or fluid identified within the endometrial canal. Right ovary Measurements: 3.4 x 1.4 x 1.8 cm. Normal appearance. Multiple small follicles. No mass or free fluid within the adjacent right adnexa. Left ovary Measurements: 2.2 x 1.1 x 1.8 cm. Normal appearance. Several small follicles. No mass or free fluid within the adjacent left adnexa. Other findings No abnormal free fluid. IMPRESSION: Normal pelvic ultrasound. Electronically Signed  By: Bary RichardStan  Maynard M.D.   On: 05/17/2017 10:44    Procedures Procedures (including critical care time)  Medications Ordered in ED Medications  ibuprofen (ADVIL,MOTRIN) tablet 800 mg (800 mg Oral Given 05/17/17 0944)     Initial Impression / Assessment and Plan / ED Course  I have reviewed the triage vital signs and the nursing notes.  Pertinent labs & imaging results that were available during my care of the patient were reviewed by me and considered in my medical decision making (see chart for details).  Pelvic pain of uncertain cause.  No clear ovarian cyst obvious on physical exam.  Will send for pelvic ultrasound.  Old records are reviewed, and she has no relevant past visits.  Wet prep shows presence of clue cells.  However, she does not have bacterial vaginosis based on clinical presentation, so this will not be treated.  Pelvic ultrasound shows no  pathology to explain her pain.  She had modest relief of pain from ibuprofen.  She is discharged with prescription for tramadol and is referred to women's clinic for follow-up.  Advised that culture results are pending and she would be contacted if any come back positive.  Final Clinical Impressions(s) / ED Diagnoses   Final diagnoses:  Pelvic pain in female    New Prescriptions New Prescriptions   TRAMADOL (ULTRAM) 50 MG TABLET    Take 1 tablet (50 mg total) by mouth every 6 (six) hours as needed.     Dione BoozeGlick, Yisroel Mullendore, MD 05/17/17 1058

## 2017-05-17 NOTE — Discharge Instructions (Signed)
Your ultrasound did not show any cysts, and did not show the cause of your pain. Cultures were obtained, and we will contact you if they are positive. Please take acetaminophen and ibuprofen as needed for pain, take tramadol for more severe pain. [facility]. Return if symptoms are getting worse.

## 2017-05-17 NOTE — ED Notes (Signed)
Pt transported to US

## 2017-05-17 NOTE — ED Triage Notes (Signed)
Patient complains of pelvic/ovary pain for the past few days, describes as pressure. Reports that her LMP is 5 days late.

## 2017-05-18 LAB — HIV ANTIBODY (ROUTINE TESTING W REFLEX): HIV SCREEN 4TH GENERATION: NONREACTIVE

## 2017-05-18 LAB — GC/CHLAMYDIA PROBE AMP (~~LOC~~) NOT AT ARMC
CHLAMYDIA, DNA PROBE: NEGATIVE
Neisseria Gonorrhea: NEGATIVE

## 2017-05-18 LAB — RPR: RPR: NONREACTIVE

## 2017-07-10 ENCOUNTER — Emergency Department (HOSPITAL_COMMUNITY)
Admission: EM | Admit: 2017-07-10 | Discharge: 2017-07-10 | Disposition: A | Discharge status: Home / Self Care | Payer: No Typology Code available for payment source | Attending: Emergency Medicine | Admitting: Emergency Medicine

## 2017-07-10 ENCOUNTER — Other Ambulatory Visit: Payer: Self-pay

## 2017-07-10 ENCOUNTER — Encounter (HOSPITAL_COMMUNITY): Payer: Self-pay

## 2017-07-10 DIAGNOSIS — M791 Myalgia, unspecified site: Secondary | ICD-10-CM | POA: Insufficient documentation

## 2017-07-10 DIAGNOSIS — Z79899 Other long term (current) drug therapy: Secondary | ICD-10-CM | POA: Insufficient documentation

## 2017-07-10 DIAGNOSIS — M7918 Myalgia, other site: Secondary | ICD-10-CM

## 2017-07-10 MED ORDER — DICLOFENAC SODIUM 50 MG PO TBEC: 50.0000 mg | DELAYED_RELEASE_TABLET | Freq: Two times a day (BID) | ORAL | 0 refills | Status: DC

## 2017-07-10 MED ORDER — METHOCARBAMOL 500 MG PO TABS: 500.0000 mg | ORAL_TABLET | Freq: Two times a day (BID) | ORAL | 0 refills | Status: DC
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
## 2017-07-10 NOTE — Discharge Instructions (Signed)
Return if any problems.

## 2017-07-10 NOTE — ED Notes (Signed)
ED Provider at bedside. 
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
## 2018-02-02 DIAGNOSIS — Z113 Encounter for screening for infections with a predominantly sexual mode of transmission: Secondary | ICD-10-CM | POA: Diagnosis not present

## 2018-02-02 DIAGNOSIS — Z30011 Encounter for initial prescription of contraceptive pills: Secondary | ICD-10-CM | POA: Diagnosis not present

## 2018-02-02 DIAGNOSIS — Z114 Encounter for screening for human immunodeficiency virus [HIV]: Secondary | ICD-10-CM | POA: Diagnosis not present

## 2018-02-02 DIAGNOSIS — Z3046 Encounter for surveillance of implantable subdermal contraceptive: Secondary | ICD-10-CM | POA: Diagnosis not present

## 2018-03-29 ENCOUNTER — Encounter: Payer: Self-pay | Admitting: General Practice

## 2018-04-01 ENCOUNTER — Inpatient Hospital Stay (HOSPITAL_COMMUNITY): Payer: Medicaid Other

## 2018-04-01 ENCOUNTER — Encounter (HOSPITAL_COMMUNITY): Payer: Self-pay | Admitting: *Deleted

## 2018-04-01 ENCOUNTER — Inpatient Hospital Stay (HOSPITAL_COMMUNITY)
Admission: AD | Admit: 2018-04-01 | Discharge: 2018-04-01 | Disposition: A | Discharge status: Home / Self Care | Payer: Medicaid Other | Source: Ambulatory Visit | Attending: Obstetrics and Gynecology | Admitting: Obstetrics and Gynecology

## 2018-04-01 ENCOUNTER — Other Ambulatory Visit: Payer: Self-pay

## 2018-04-01 DIAGNOSIS — O3481 Maternal care for other abnormalities of pelvic organs, first trimester: Secondary | ICD-10-CM | POA: Insufficient documentation

## 2018-04-01 DIAGNOSIS — O26891 Other specified pregnancy related conditions, first trimester: Secondary | ICD-10-CM | POA: Insufficient documentation

## 2018-04-01 DIAGNOSIS — R1032 Left lower quadrant pain: Secondary | ICD-10-CM | POA: Diagnosis not present

## 2018-04-01 DIAGNOSIS — R102 Pelvic and perineal pain: Secondary | ICD-10-CM | POA: Diagnosis not present

## 2018-04-01 DIAGNOSIS — R109 Unspecified abdominal pain: Secondary | ICD-10-CM

## 2018-04-01 DIAGNOSIS — Z3491 Encounter for supervision of normal pregnancy, unspecified, first trimester: Secondary | ICD-10-CM

## 2018-04-01 DIAGNOSIS — N831 Corpus luteum cyst of ovary, unspecified side: Secondary | ICD-10-CM

## 2018-04-01 DIAGNOSIS — M545 Low back pain: Secondary | ICD-10-CM | POA: Diagnosis not present

## 2018-04-01 DIAGNOSIS — N8311 Corpus luteum cyst of right ovary: Secondary | ICD-10-CM | POA: Diagnosis not present

## 2018-04-01 DIAGNOSIS — Z3A08 8 weeks gestation of pregnancy: Secondary | ICD-10-CM | POA: Insufficient documentation

## 2018-04-01 DIAGNOSIS — M549 Dorsalgia, unspecified: Secondary | ICD-10-CM | POA: Diagnosis present

## 2018-04-01 LAB — URINALYSIS, ROUTINE W REFLEX MICROSCOPIC
Bilirubin Urine: NEGATIVE
Glucose, UA: NEGATIVE mg/dL
KETONES UR: 80 mg/dL — AB
Leukocytes, UA: NEGATIVE
NITRITE: NEGATIVE
Protein, ur: NEGATIVE mg/dL
Specific Gravity, Urine: 1.021 (ref 1.005–1.030)
pH: 5 (ref 5.0–8.0)

## 2018-04-01 LAB — CBC
HCT: 37.5 % (ref 36.0–46.0)
Hemoglobin: 12.2 g/dL (ref 12.0–15.0)
MCH: 28.7 pg (ref 26.0–34.0)
MCHC: 32.5 g/dL (ref 30.0–36.0)
MCV: 88.2 fL (ref 78.0–100.0)
PLATELETS: 273 10*3/uL (ref 150–400)
RBC: 4.25 MIL/uL (ref 3.87–5.11)
RDW: 12.6 % (ref 11.5–15.5)
WBC: 7 10*3/uL (ref 4.0–10.5)

## 2018-04-01 LAB — WET PREP, GENITAL
Clue Cells Wet Prep HPF POC: NONE SEEN
Sperm: NONE SEEN
Trich, Wet Prep: NONE SEEN
YEAST WET PREP: NONE SEEN

## 2018-04-01 LAB — POCT PREGNANCY, URINE: PREG TEST UR: POSITIVE — AB

## 2018-04-01 LAB — HCG, QUANTITATIVE, PREGNANCY: HCG, BETA CHAIN, QUANT, S: 15471 m[IU]/mL — AB (ref <5)

## 2018-04-01 NOTE — MAU Provider Note (Signed)
Chief Complaint: Abdominal Pain and Back Pain   First Provider Initiated Contact with Patient 04/01/18 2138        SUBJECTIVE HPI: Gina Hayden is a 37 y.o. [id number] at Unknown by LMP who presents to maternity admissions reporting positive pregnancy test with sharp shooting pains on left lower pelvis for 5 days.  Has not had any work up for this.  Has OB appt at HD. She denies vaginal bleeding, vaginal itching/burning, urinary symptoms, h/a, dizziness, n/v, or fever/chills.    Abdominal Pain  This is a new problem. The current episode started in the past 7 days. The problem has been unchanged. The pain is located in the LLQ. The quality of the pain is sharp. The abdominal pain radiates to the back. Pertinent negatives include no constipation, diarrhea, dysuria, fever, myalgias, nausea or vomiting. The pain is aggravated by palpation. The pain is relieved by nothing. She has tried nothing for the symptoms.  Back Pain  This is a new problem. The current episode started in the past 7 days. The problem occurs intermittently. The pain is present in the lumbar spine. Associated symptoms include abdominal pain. Pertinent negatives include no dysuria or fever. She has tried nothing for the symptoms.   RN Note: Having back pain, cramping- lower abd and sharp, shooting pains in LLQ, (if you push in that area).  Been going on for like for 5 days.  Is getting worse, constant the last 2 days.  +preg test at [facility]- about 4 wks ago. Went there again last wk, dx with IUP.  Past Medical History:  Diagnosis Date  . No pertinent past medical history    Past Surgical History:  Procedure Laterality Date  . DILATION AND CURETTAGE OF UTERUS     abortion  . WISDOM TOOTH EXTRACTION     Social History   Socioeconomic History  . Marital status: Married    Spouse name: Not on file  . Number of children: 2  . Years of education: Not on file  . Highest education level: Not on file  Occupational  History  . Occupation: billing    Employer: [employer]  Social Needs  . Financial resource strain: Not on file  . Food insecurity:    Worry: Not on file    Inability: Not on file  . Transportation needs:    Medical: Not on file    Non-medical: Not on file  Tobacco Use  . Smoking status: Never Smoker  . Smokeless tobacco: Never Used  Substance and Sexual Activity  . Alcohol use: Yes    Comment: Socially  . Drug use: No  . Sexual activity: Yes  Lifestyle  . Physical activity:    Days per week: Not on file    Minutes per session: Not on file  . Stress: Not on file  Relationships  . Social connections:    Talks on phone: Not on file    Gets together: Not on file    Attends religious service: Not on file    Active member of club or organization: Not on file    Attends meetings of clubs or organizations: Not on file    Relationship status: Not on file  . Intimate partner violence:    Fear of current or ex partner: Not on file    Emotionally abused: Not on file    Physically abused: Not on file    Forced sexual activity: Not on file  Other Topics Concern  . Not  on file  Social History Narrative   Lives with husband and 2 sons in a 2 story home.     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     Education: college.    No current facility-administered medications on file prior to encounter.    Current Outpatient Medications on File Prior to Encounter  Medication Sig Dispense Refill  . acetaminophen (TYLENOL) 325 MG tablet Take 650 mg by mouth every 6 (six) hours as needed.    . Calcium Carbonate-Vit D-Min (CALCIUM 1200 PO) Take by mouth.    . cyanocobalamin 1000 MCG tablet Take 1,000 mcg by mouth daily.    . Multiple Vitamin (MULTIVITAMIN) tablet Take 1 tablet by mouth daily.    . diclofenac (VOLTAREN) 50 MG EC tablet Take 1 tablet (50 mg total) by mouth 2 (two) times daily. 20 tablet 0  . methocarbamol (ROBAXIN) 500 MG tablet Take 1 tablet (500 mg total) by mouth 2 (two) times daily. 20  tablet 0  . traMADol (ULTRAM) 50 MG tablet Take 1 tablet (50 mg total) by mouth every 6 (six) hours as needed. 15 tablet 0   No Active Allergies  I have reviewed patient's Past Medical Hx, Surgical Hx, Family Hx, Social Hx, medications and allergies.   ROS:  Review of Systems  Constitutional: Negative for fever.  Gastrointestinal: Positive for abdominal pain. Negative for constipation, diarrhea, nausea and vomiting.  Genitourinary: Negative for dysuria.  Musculoskeletal: Positive for back pain. Negative for myalgias.   Review of Systems  Other systems negative   Physical Exam  Physical Exam Patient Vitals for the past 24 hrs:  BP Temp Temp src Pulse Resp SpO2 Weight  04/01/18 1820 127/90 98.5 F (36.9 C) Oral 83 17 99 % 64.5 kg   Constitutional: Well-developed, well-nourished female in no acute distress.  Cardiovascular: normal rate Respiratory: normal effort GI: Abd soft, non-tender. Pos BS x 4 MS: Extremities nontender, no edema, normal ROM Neurologic: Alert and oriented x 4.  GU: Neg CVAT.  PELVIC EXAM: Cervix pink, visually closed, without lesion, scant white creamy discharge, vaginal walls and external genitalia normal Bimanual exam: Cervix 0/long/high, firm, anterior, neg CMT, uterus mildly tender, enlarged to 8 wks size, adnexa without tenderness, enlargement, or mass   LAB RESULTS Results for orders placed or performed during the hospital encounter of 04/01/18 (from the past 24 hour(s))  Urinalysis, Routine w reflex microscopic     Status: Abnormal   Collection Time: 04/01/18  7:03 PM  Result Value Ref Range   Color, Urine YELLOW YELLOW   APPearance CLEAR CLEAR   Specific Gravity, Urine 1.021 1.005 - 1.030   pH 5.0 5.0 - 8.0   Glucose, UA NEGATIVE NEGATIVE mg/dL   Hgb urine dipstick SMALL (A) NEGATIVE   Bilirubin Urine NEGATIVE NEGATIVE   Ketones, ur 80 (A) NEGATIVE mg/dL   Protein, ur NEGATIVE NEGATIVE mg/dL   Nitrite NEGATIVE NEGATIVE   Leukocytes, UA  NEGATIVE NEGATIVE   RBC / HPF 0-5 0 - 5 RBC/hpf   WBC, UA 0-5 0 - 5 WBC/hpf   Bacteria, UA RARE (A) NONE SEEN   Squamous Epithelial / LPF 0-5 0 - 5   Mucus PRESENT   Pregnancy, urine POC     Status: Abnormal   Collection Time: 04/01/18  7:07 PM  Result Value Ref Range   Preg Test, Ur POSITIVE (A) NEGATIVE  CBC     Status: None   Collection Time: 04/01/18  7:56 PM  Result Value Ref Range  WBC 7.0 4.0 - 10.5 K/uL   RBC 4.25 3.87 - 5.11 MIL/uL   Hemoglobin 12.2 12.0 - 15.0 g/dL   HCT 40.937.5 81.136.0 - 91.446.0 %   MCV 88.2 78.0 - 100.0 fL   MCH 28.7 26.0 - 34.0 pg   MCHC 32.5 30.0 - 36.0 g/dL   RDW 78.212.6 95.611.5 - 21.315.5 %   Platelets 273 150 - 400 K/uL  hCG, quantitative, pregnancy     Status: Abnormal   Collection Time: 04/01/18  7:56 PM  Result Value Ref Range   hCG, Beta Chain, Quant, S 15,471 (H) <5 mIU/mL       IMAGING [place] Ob Comp Less 14 Wks  Result Date: 04/01/2018 CLINICAL DATA:  Abdominal pain during first trimester pregnancy, abdominal pain for 5 days, with a quantitative beta HCG =15,471, EGA [redacted] weeks 1 day by LMP of 02/03/2018 EXAM: OBSTETRIC <14 WK ULTRASOUND TECHNIQUE: Transabdominal ultrasound was performed for evaluation of the gestation as well as the maternal uterus and adnexal regions. COMPARISON:  None for this gestation FINDINGS: Intrauterine gestational sac: Present, single Yolk sac:  Present Embryo:  Present Cardiac Activity: Present Heart Rate: 173 bpm CRL:   20.6 mm   8 w 4 d                  US EDC: 11/07/2018 Subchorionic hemorrhage:  None visualized. Maternal uterus/adnexae: LEFT ovary normal size and morphology 1.7 x 2.6 x 1.3 cm. RIGHT ovary measures 2.6 x 3.0 x 2.4 cm and contains a corpus luteal cyst. No free pelvic fluid or adnexal masses. IMPRESSION: Single live intrauterine gestation at 8 weeks 4 days EGA by crown-rump length. No acute abnormalities identified by transabdominal imaging. Electronically Signed   By: Ulyses SouthwardMark  Boles M.D.   On: 04/01/2018 22:20     MAU  Management/MDM: Ordered usual first trimester r/o ectopic labs.   Pelvic exam and cultures done Will check baseline Ultrasound to rule out ectopic.  This bleeding/pain can represent a normal pregnancy with bleeding, spontaneous abortion or even an ectopic which can be life-threatening.  The process as listed above helps to determine which of these is present.  Reviewed results.  Has Corpus Luteum cyst on right.  Cannot explain LLQ pain.  Recommend followup with HD as scheduled  ASSESSMENT 1. Abdominal pain during pregnancy, first trimester   2. Abdominal pain during pregnancy, first trimester   3.     Live single intrauterine pregnancy at 262w2d   PLAN Discharge home [facility]  Pt stable at time of discharge. Encouraged to return here or to other Urgent Care/ED if she develops worsening of symptoms, increase in pain, fever, or other concerning symptoms.    Wynelle BourgeoisMarie Jamarion [place] CNM, MSN Certified Nurse-Midwife 04/01/2018  9:38 PM

## 2018-04-01 NOTE — MAU Note (Signed)
Having back pain, cramping- lower abd and sharp, shooting pains in LLQ, (if you push in that area).  Been going on for like for 5 days.  Is getting worse, constant the last 2 days.  [facility]. Went there again last wk, dx with IUP.

## 2018-04-01 NOTE — Discharge Instructions (Signed)
Abdominal Pain During Pregnancy Abdominal pain is common in pregnancy. Most of the time, it does not cause harm. There are many causes of abdominal pain. Some causes are more serious than others and sometimes the cause is not known. Abdominal pain can be a sign that something is very wrong with the pregnancy or the pain may have nothing to do with the pregnancy. Always tell your health care provider if you have any abdominal pain. Follow these instructions at home:  Do not have sex or put anything in your vagina until your symptoms go away completely.  Watch your abdominal pain for any changes.  Get plenty of rest until your pain improves.  Drink enough fluid to keep your urine clear or pale yellow.  Take over-the-counter or prescription medicines only as told by your health care provider.  Keep all follow-up visits as told by your health care provider. This is important. Contact a health care provider if:  You have a fever.  Your pain gets worse or you have cramping.  Your pain continues after resting. Get help right away if:  You are bleeding, leaking fluid, or passing tissue from the vagina.  You have vomiting or diarrhea that does not go away.  You have painful or bloody urination.  You notice a decrease in your baby's movements.  You feel very weak or faint.  You have shortness of breath.  You develop a severe headache with abdominal pain.  You have abnormal vaginal discharge with abdominal pain. This information is not intended to replace advice given to you by your health care provider. Make sure you discuss any questions you have with your health care provider. Document Released: 07/07/2005 Document Revised: 04/17/2016 Document Reviewed: 02/03/2013 Elsevier Interactive Patient Education  2018 [place] of Pregnancy The first trimester of pregnancy is from week 1 until the end of week 13 (months 1 through 3). During this time, your baby will  begin to develop inside you. At 6-8 weeks, the eyes and face are formed, and the heartbeat can be seen on ultrasound. At the end of 12 weeks, all the baby's organs are formed. Prenatal care is all the medical care you receive before the birth of your baby. Make sure you get good prenatal care and follow all of your doctor's instructions. Follow these instructions at home: Medicines  Take over-the-counter and prescription medicines only as told by your doctor. Some medicines are safe and some medicines are not safe during pregnancy.  Take a prenatal vitamin that contains at least 600 micrograms (mcg) of folic acid.  If you have trouble pooping (constipation), take medicine that will make your stool soft (stool softener) if your doctor approves. Eating and drinking  Eat regular, healthy meals.  Your doctor will tell you the amount of weight gain that is right for you.  Avoid raw meat and uncooked cheese.  If you feel sick to your stomach (nauseous) or throw up (vomit): ? Eat 4 or 5 small meals a day instead of 3 large meals. ? Try eating a few soda crackers. ? Drink liquids between meals instead of during meals.  To prevent constipation: ? Eat foods that are high in fiber, like fresh fruits and vegetables, whole grains, and beans. ? Drink enough fluids to keep your pee (urine) clear or pale yellow. Activity  Exercise only as told by your doctor. Stop exercising if you have cramps or pain in your lower belly (abdomen) or low back.  Do not  exercise if it is too hot, too humid, or if you are in a place of great height (high altitude).  Try to avoid standing for long periods of time. Move your legs often if you must stand in one place for a long time.  Avoid heavy lifting.  Wear low-heeled shoes. Sit and stand up straight.  You can have sex unless your doctor tells you not to. Relieving pain and discomfort  Wear a good support bra if your breasts are sore.  Take warm water baths  (sitz baths) to soothe pain or discomfort caused by hemorrhoids. Use hemorrhoid cream if your doctor says it is okay.  Rest with your legs raised if you have leg cramps or low back pain.  If you have puffy, bulging veins (varicose veins) in your legs: ? Wear support hose or compression stockings as told by your doctor. ? Raise (elevate) your feet for 15 minutes, 3-4 times a day. ? Limit salt in your food. Prenatal care  Schedule your prenatal visits by the twelfth week of pregnancy.  Write down your questions. Take them to your prenatal visits.  Keep all your prenatal visits as told by your doctor. This is important. Safety  Wear your seat belt at all times when driving.  Make a list of emergency phone numbers. The list should include numbers for family, friends, the hospital, and police and fire departments. General instructions  Ask your doctor for a referral to a local prenatal class. Begin classes no later than at the start of month 6 of your pregnancy.  Ask for help if you need counseling or if you need help with nutrition. Your doctor can give you advice or tell you where to go for help.  Do not use hot tubs, steam rooms, or saunas.  Do not douche or use tampons or scented sanitary pads.  Do not cross your legs for long periods of time.  Avoid all herbs and alcohol. Avoid drugs that are not approved by your doctor.  Do not use any tobacco products, including cigarettes, chewing tobacco, and electronic cigarettes. If you need help quitting, ask your doctor. You may get counseling or other support to help you quit.  Avoid cat litter boxes and soil used by cats. These carry germs that can cause birth defects in the baby and can cause a loss of your baby (miscarriage) or stillbirth.  Visit your dentist. At home, brush your teeth with a soft toothbrush. Be gentle when you floss. Contact a doctor if:  You are dizzy.  You have mild cramps or pressure in your lower  belly.  You have a nagging pain in your belly area.  You continue to feel sick to your stomach, you throw up, or you have watery poop (diarrhea).  You have a bad smelling fluid coming from your vagina.  You have pain when you pee (urinate).  You have increased puffiness (swelling) in your face, hands, legs, or ankles. Get help right away if:  You have a fever.  You are leaking fluid from your vagina.  You have spotting or bleeding from your vagina.  You have very bad belly cramping or pain.  You gain or lose weight rapidly.  You throw up blood. It may look like coffee grounds.  You are around people who have [place] measles, fifth disease, or chickenpox.  You have a very bad headache.  You have shortness of breath.  You have any kind of trauma, such as from a fall or  a car accident. Summary  The first trimester of pregnancy is from week 1 until the end of week 13 (months 1 through 3).  To take care of yourself and your unborn baby, you will need to eat healthy meals, take medicines only if your doctor tells you to do so, and do activities that are safe for you and your baby.  Keep all follow-up visits as told by your doctor. This is important as your doctor will have to ensure that your baby is healthy and growing well. This information is not intended to replace advice given to you by your health care provider. Make sure you discuss any questions you have with your health care provider. Document Released: 12/24/2007 Document Revised: 07/15/2016 Document Reviewed: 07/15/2016 Elsevier Interactive Patient Education  2017 [employer]

## 2018-04-02 LAB — GC/CHLAMYDIA PROBE AMP (~~LOC~~) NOT AT ARMC
Chlamydia: NEGATIVE
Neisseria Gonorrhea: NEGATIVE

## 2018-04-02 LAB — HIV ANTIBODY (ROUTINE TESTING W REFLEX): HIV SCREEN 4TH GENERATION: NONREACTIVE

## 2018-04-14 ENCOUNTER — Telehealth: Payer: Self-pay | Admitting: *Deleted

## 2018-04-14 NOTE — Telephone Encounter (Signed)
Patient called stating she was having some bleeding, dark colored blood and cramping (felt like period cramps).  Last sex was 2 days ago. Advised pt to go to MAU for further evaluation.  Clovis PuMartin, Kapri Nero L, RN

## 2018-04-17 ENCOUNTER — Encounter (HOSPITAL_COMMUNITY): Payer: Self-pay | Admitting: *Deleted

## 2018-04-17 ENCOUNTER — Inpatient Hospital Stay (HOSPITAL_COMMUNITY)
Admission: AD | Admit: 2018-04-17 | Discharge: 2018-04-17 | Disposition: A | Discharge status: Home / Self Care | Payer: Medicaid Other | Source: Ambulatory Visit | Attending: Family Medicine | Admitting: Family Medicine

## 2018-04-17 ENCOUNTER — Other Ambulatory Visit: Payer: Self-pay

## 2018-04-17 ENCOUNTER — Inpatient Hospital Stay (HOSPITAL_COMMUNITY): Payer: Medicaid Other

## 2018-04-17 DIAGNOSIS — O034 Incomplete spontaneous abortion without complication: Secondary | ICD-10-CM | POA: Diagnosis not present

## 2018-04-17 DIAGNOSIS — Z3A10 10 weeks gestation of pregnancy: Secondary | ICD-10-CM

## 2018-04-17 DIAGNOSIS — O209 Hemorrhage in early pregnancy, unspecified: Secondary | ICD-10-CM | POA: Diagnosis not present

## 2018-04-17 LAB — CBC
HCT: 37.4 % (ref 36.0–46.0)
Hemoglobin: 12.3 g/dL (ref 12.0–15.0)
MCH: 28.7 pg (ref 26.0–34.0)
MCHC: 32.9 g/dL (ref 30.0–36.0)
MCV: 87.2 fL (ref 78.0–100.0)
Platelets: 278 10*3/uL (ref 150–400)
RBC: 4.29 MIL/uL (ref 3.87–5.11)
RDW: 12.6 % (ref 11.5–15.5)
WBC: 7.2 10*3/uL (ref 4.0–10.5)

## 2018-04-17 LAB — URINALYSIS, ROUTINE W REFLEX MICROSCOPIC
Bilirubin Urine: NEGATIVE
Glucose, UA: NEGATIVE mg/dL
Ketones, ur: NEGATIVE mg/dL
Leukocytes, UA: NEGATIVE
Nitrite: NEGATIVE
Protein, ur: NEGATIVE mg/dL
Specific Gravity, Urine: 1.025 (ref 1.005–1.030)
pH: 6 (ref 5.0–8.0)

## 2018-04-17 LAB — WET PREP, GENITAL
Clue Cells Wet Prep HPF POC: NONE SEEN
Sperm: NONE SEEN
Trich, Wet Prep: NONE SEEN
Yeast Wet Prep HPF POC: NONE SEEN

## 2018-04-17 LAB — HCG, QUANTITATIVE, PREGNANCY: hCG, Beta Chain, Quant, S: 11498 m[IU]/mL — ABNORMAL HIGH (ref <5)

## 2018-04-17 MED ORDER — ONDANSETRON HCL 4 MG PO TABS: 4.0000 mg | ORAL_TABLET | Freq: Three times a day (TID) | ORAL | 0 refills | Status: DC | PRN

## 2018-04-17 MED ORDER — ACETAMINOPHEN-CODEINE #3 300-30 MG PO TABS: 1.0000 | ORAL_TABLET | Freq: Four times a day (QID) | ORAL | 0 refills | Status: DC | PRN

## 2018-04-17 MED ORDER — MISOPROSTOL 200 MCG PO TABS: ORAL_TABLET | ORAL | 0 refills | Status: DC

## 2018-04-17 NOTE — MAU Note (Signed)
Pt reports vaginal bleeding for 6 days and some period like cramping. Pt describes the bleeding as brown in color.

## 2018-04-17 NOTE — Discharge Instructions (Signed)

## 2018-04-17 NOTE — MAU Provider Note (Signed)
History     CSN: [id number]  Arrival date and time: 04/17/18 2016   First Provider Initiated Contact with Patient 04/17/18 2050      Chief Complaint  Patient presents with  . Vaginal Bleeding   Gina Hayden is a 37 y.o. G4P2 at [redacted]w[redacted]d who presents to MAU with complaints of vaginal bleeding and abdominal pain. She reports vaginal bleeding started 6 days ago, describes vaginal bleeding as dark brown discharge mixed with old blood. Reports vaginal bleeding occurs when she wipes, denies having to wear a pad or panty liner. She reports recent IC 2 days ago while the bleeding was occurring. She reports abdominal pain is associated with vaginal bleeding. Abdominal pain started occurring 3 days ago, describes abdominal pain as "period like cramping". Rates pain 3/10- has not taken any medication for abdominal pain. She denies urinary symptoms, HA, or N/V. Has initial prenatal appointment scheduled for this Tuesday at [facility].    OB History    Gravida  4   Para  2   Term  2   Preterm      AB  1   Living  2     SAB      TAB  1   Ectopic      Multiple      Live Births  2           Past Medical History:  Diagnosis Date  . No pertinent past medical history     Past Surgical History:  Procedure Laterality Date  . DILATION AND CURETTAGE OF UTERUS     abortion  . WISDOM TOOTH EXTRACTION      Family History  Problem Relation Age of Onset  . Hypertension Mother   . Hyperlipidemia Mother   . Hypertension Sister   . Hyperlipidemia Sister   . Stroke Father   . Stroke Maternal Grandmother   . Diabetes Mellitus I Paternal Grandmother     Social History   Tobacco Use  . Smoking status: Never Smoker  . Smokeless tobacco: Never Used  Substance Use Topics  . Alcohol use: Yes    Comment: Socially  . Drug use: No    Allergies: No Known Allergies  Medications Prior to Admission  Medication Sig Dispense Refill Last Dose  . Calcium Carbonate-Vit D-Min (CALCIUM  1200 PO) Take by mouth.   03/31/2018 at Unknown time  . Multiple Vitamin (MULTIVITAMIN) tablet Take 1 tablet by mouth daily.   Past Week at Unknown time  . acetaminophen (TYLENOL) 325 MG tablet Take 650 mg by mouth every 6 (six) hours as needed.   More than a month at Unknown time    Review of Systems  Constitutional: Negative.   Respiratory: Negative.   Cardiovascular: Negative.   Gastrointestinal: Positive for abdominal pain. Negative for constipation, diarrhea, nausea and vomiting.  Genitourinary: Positive for vaginal bleeding. Negative for difficulty urinating, dysuria, frequency, pelvic pain and urgency.  Neurological: Negative.    Physical Exam   Vitals:   04/17/18 2033 04/17/18 2244  BP: 132/85 125/80  Pulse: 85   Resp: 18   Temp: 98.2 F (36.8 C)   SpO2: 99%   Weight: 65.3 kg    Physical Exam  Nursing note and vitals reviewed. Constitutional: She is oriented to person, place, and time. She appears well-developed and well-nourished. No distress.  HENT:  Head: Normocephalic.  Cardiovascular: Normal rate, regular rhythm and normal heart sounds.  Respiratory: Effort normal and breath sounds normal. No respiratory distress. She  has no wheezes.  GI: Soft. She exhibits no distension. There is no tenderness.  Genitourinary:  Genitourinary Comments: Pelvic examination: Cervix visually closed, small amount of reddish brown vaginal bleeding present around cervix os, vaginal swabs collected.  Bimanual exam: Cervix closed, neg CMT, uterus nontender   Musculoskeletal: Normal range of motion. She exhibits no edema.  Neurological: She is alert and oriented to person, place, and time.  Psychiatric: She has a normal mood and affect. Her behavior is normal. Thought content normal.   Bedside US performed for viability and noted no FHR- official US ordered for confirmation   MAU Course  Procedures  MDM Orders Placed This Encounter  Procedures  . Wet prep, genital  . US OB  Transvaginal  . Urinalysis, Routine w reflex microscopic   Labs and [place] results reviewed:  Results for orders placed or performed during the hospital encounter of 04/17/18 (from the past 24 hour(s))  Urinalysis, Routine w reflex microscopic     Status: Abnormal   Collection Time: 04/17/18  8:36 PM  Result Value Ref Range   Color, Urine YELLOW YELLOW   APPearance HAZY (A) CLEAR   Specific Gravity, Urine 1.025 1.005 - 1.030   pH 6.0 5.0 - 8.0   Glucose, UA NEGATIVE NEGATIVE mg/dL   Hgb urine dipstick MODERATE (A) NEGATIVE   Bilirubin Urine NEGATIVE NEGATIVE   Ketones, ur NEGATIVE NEGATIVE mg/dL   Protein, ur NEGATIVE NEGATIVE mg/dL   Nitrite NEGATIVE NEGATIVE   Leukocytes, UA NEGATIVE NEGATIVE   RBC / HPF 0-5 0 - 5 RBC/hpf   WBC, UA 0-5 0 - 5 WBC/hpf   Bacteria, UA RARE (A) NONE SEEN   Squamous Epithelial / LPF 0-5 0 - 5   Mucus PRESENT   Wet prep, genital     Status: Abnormal   Collection Time: 04/17/18  8:59 PM  Result Value Ref Range   Yeast Wet Prep HPF POC NONE SEEN NONE SEEN   Trich, Wet Prep NONE SEEN NONE SEEN   Clue Cells Wet Prep HPF POC NONE SEEN NONE SEEN   WBC, Wet Prep HPF POC MANY (A) NONE SEEN   Sperm NONE SEEN   hCG, quantitative, pregnancy     Status: Abnormal   Collection Time: 04/17/18  9:58 PM  Result Value Ref Range   hCG, Beta Chain, Quant, S 11,498 (H) <5 mIU/mL  CBC     Status: None   Collection Time: 04/17/18  9:58 PM  Result Value Ref Range   WBC 7.2 4.0 - 10.5 K/uL   RBC 4.29 3.87 - 5.11 MIL/uL   Hemoglobin 12.3 12.0 - 15.0 g/dL   HCT 81.1 91.4 - 78.2 %   MCV 87.2 78.0 - 100.0 fL   MCH 28.7 26.0 - 34.0 pg   MCHC 32.9 30.0 - 36.0 g/dL   RDW 95.6 21.3 - 08.6 %   Platelets 278 150 - 400 K/uL   US Ob Transvaginal  Result Date: 04/17/2018 CLINICAL DATA:  Pregnant, bleeding EXAM: TRANSVAGINAL OB ULTRASOUND TECHNIQUE: Transvaginal ultrasound was performed for complete evaluation of the gestation as well as the maternal uterus, adnexal regions, and  pelvic cul-de-sac. COMPARISON:  04/01/2018 FINDINGS: Intrauterine gestational sac: Single Yolk sac:  Not Visualized. Embryo:  Visualized. Cardiac Activity: Not Visualized. Heart Rate: 0 bpm CRL:   32.9 mm   10 w 1 d Subchorionic hemorrhage:  None visualized. Maternal uterus/adnexae: Bilateral ovaries are within normal limits, noting a right corpus luteal cyst. No free fluid. IMPRESSION: Single live  intrauterine gestation, measuring 10 weeks 1 day by crown-rump length, without cardiac activity. Findings meet definitive criteria for failed pregnancy. This follows SRU consensus guidelines: Diagnostic Criteria for Nonviable Pregnancy Early in the First Trimester. Macy Mis J Med [phone]. These results were called by telephone at the time of interpretation on 04/17/2018 at 9:31 pm to [facility], nurse in MAU, who verbally acknowledged these results. Electronically Signed   By: Charline Bills M.D.   On: 04/17/2018 21:33   [place] on 9/12 noted live IUP. Discussed results of [place] with patient and dx with incomplete miscarriage. Educated on options of expectant management vs Cytotec management vs. D&E for failed pregnancy in detail. Gave patient time alone to discuss with boyfriend. Patient agrees for Cytotec administration at home. Discussed plan of care with patient and need for repeat lab work in 1 week and appointment with provider in 2 weeks. Educated on reasons to return to MAU immediately with soaking more than 2 pads per hour for at least 2 hours, lightheaded, dizziness, or syncope. Patient verbalizes understanding and agrees to plan of care. Rx for Cytotec vaginally, Tylenol #3 and Zofran ODT sent to pharmacy of choice. Pt stable at time of discharge.   Pt verbalizes that she lives close to the hospital and has transportation readily available.  Pt appears reliable and verbalizes understanding and agrees with plan of care  Assessment and Plan   1. Incomplete miscarriage   2. [redacted] weeks gestation of  pregnancy    Discharge home  Follow up in 1 week for repeat labs and 2 weeks for appointment with provider  Return to MAU as needed for reasons discussed  Rx for Cytotec, Tylenol #3 and Zofran   Follow-up Information    [facility]. Schedule an appointment as soon as possible for a visit in 1 week(s).   Specialty:  Obstetrics and Gynecology Why:  Make appointment to be seen in 1 week for repeat labs and 2 weeks with a provider Contact information: [address] [place] [facility] [place] [address] [phone]         Allergies as of 04/17/2018   No Known Allergies     Medication List    TAKE these medications   acetaminophen 325 MG tablet Commonly known as:  TYLENOL Take 650 mg by mouth every 6 (six) hours as needed.   acetaminophen-codeine 300-30 MG tablet Commonly known as:  TYLENOL #3 Take 1-2 tablets by mouth every 6 (six) hours as needed for moderate pain.   CALCIUM 1200 PO Take by mouth.   misoprostol 200 MCG tablet Commonly known as:  CYTOTEC Insert all four tablets vaginally at the same time   multivitamin tablet Take 1 tablet by mouth daily.   ondansetron 4 MG tablet Commonly known as:  ZOFRAN Take 1 tablet (4 mg total) by mouth every 8 (eight) hours as needed for nausea or vomiting.       Sharyon Cable CNM 04/17/2018, 11:04 PM

## 2018-04-19 ENCOUNTER — Telehealth: Payer: Self-pay | Admitting: General Practice

## 2018-04-19 LAB — GC/CHLAMYDIA PROBE AMP (~~LOC~~) NOT AT ARMC
Chlamydia: NEGATIVE
Neisseria Gonorrhea: NEGATIVE

## 2018-04-19 NOTE — Telephone Encounter (Signed)
Called and notified patient of appts scheduled for 04/22/18 for  repeat bHCG and 04/29/18 for SAB follow up.  Patient verbalized understanding.

## 2018-04-22 ENCOUNTER — Encounter: Payer: Self-pay | Admitting: Obstetrics and Gynecology

## 2018-04-22 ENCOUNTER — Ambulatory Visit (INDEPENDENT_AMBULATORY_CARE_PROVIDER_SITE_OTHER): Payer: Medicaid Other | Admitting: Obstetrics and Gynecology

## 2018-04-22 ENCOUNTER — Other Ambulatory Visit: Payer: Medicaid Other

## 2018-04-22 VITALS — BP 140/91 | HR 80 | Resp 16 | Ht 61.0 in | Wt 145.8 lb

## 2018-04-22 DIAGNOSIS — O039 Complete or unspecified spontaneous abortion without complication: Secondary | ICD-10-CM

## 2018-04-22 NOTE — Progress Notes (Signed)
History   Chief Complaint:  Miscarriage (Follow up)   Gina Hayden is  37 y.o. [id number] Patient's last menstrual period was 02/03/2018.Marland Kitchen Patient is here for follow up of quantitative HCG and ongoing surveillance of pregnancy status. She is [redacted]w[redacted]d weeks gestation  by LMP.    Since her last visit, the patient is without new complaint.   The patient reports bleeding as  lighter than period.  She reports she passed the pregnancy on 04/18/18.  General ROS:  negative  Her previous Quantitative HCG values are: 15,471 on 04/01/18 11,498 on 04/17/2018  Physical Exam   Blood pressure (!) 140/91, pulse 80, resp. rate 16, height 5\' 1"  (1.549 m), weight 145 lb 12.8 oz (66.1 kg), last menstrual period 02/03/2018, not currently breastfeeding.  Focused Gynecological Exam: examination not done per pt request  Labs: Results for orders placed or performed in visit on 04/22/18 (from the past 72 hour(s))  Beta hCG quant (ref lab)     Status: None   Collection Time: 04/22/18  3:26 PM  Result Value Ref Range   hCG Quant 268 mIU/mL    Comment:                      Female (Non-pregnant)    0 -     5                             (Postmenopausal)  0 -     8                      Female (Pregnant)                      Weeks of Gestation                              3                6 -    71                              4               10 -   750                              5              217 -  7138                              6              158 - 31795                              7             [id number] -[id number]                              8            32065 -[phone]  9            63803 -[phone]                             10            46509 -[phone]                             12            27832 -[phone]                             14            13950 - 62530                             15            12039 - 70971                             16             9040 - 56451                     17             [phone] - [phone]                             18             8099 - [phone] Roche E CLIA methodology      Ultrasound Studies:   US Ob Comp Less 14 Wks  Result Date: 04/01/2018 CLINICAL DATA:  Abdominal pain during first trimester pregnancy, abdominal pain for 5 days, with a quantitative beta HCG =15,471, EGA [redacted] weeks 1 day by LMP of 02/03/2018 EXAM: OBSTETRIC <14 WK ULTRASOUND TECHNIQUE: Transabdominal ultrasound was performed for evaluation of the gestation as well as the maternal uterus and adnexal regions. COMPARISON:  None for this gestation FINDINGS: Intrauterine gestational sac: Present, single Yolk sac:  Present Embryo:  Present Cardiac Activity: Present Heart Rate: 173 bpm CRL:   20.6 mm   8 w 4 d                  [place] EDC: 11/07/2018 Subchorionic hemorrhage:  None visualized. Maternal uterus/adnexae: LEFT ovary normal size and morphology 1.7 x 2.6 x 1.3 cm. RIGHT ovary measures 2.6 x 3.0 x 2.4 cm and contains a corpus luteal cyst. No free pelvic fluid or adnexal masses. IMPRESSION: Single live intrauterine gestation at 8 weeks 4 days EGA by crown-rump length. No acute abnormalities identified by transabdominal imaging. Electronically Signed   By: Ulyses Southward M.D.   On: 04/01/2018 22:20   US Ob Transvaginal  Result Date: 04/17/2018 CLINICAL DATA:  Pregnant, bleeding EXAM: TRANSVAGINAL OB ULTRASOUND TECHNIQUE: Transvaginal ultrasound was performed for complete evaluation of the gestation as well as the maternal uterus, adnexal regions, and pelvic cul-de-sac. COMPARISON:  04/01/2018 FINDINGS: Intrauterine gestational sac: Single Yolk sac:  Not Visualized. Embryo:  Visualized. Cardiac Activity: Not Visualized. Heart Rate: 0 bpm CRL:   32.9 mm   10 w 1 d Subchorionic hemorrhage:  None visualized. Maternal uterus/adnexae: Bilateral  ovaries are within normal limits, noting a right corpus luteal cyst. No free fluid. IMPRESSION: Single live intrauterine gestation, measuring 10  weeks 1 day by crown-rump length, without cardiac activity. Findings meet definitive criteria for failed pregnancy. This follows SRU consensus guidelines: Diagnostic Criteria for Nonviable Pregnancy Early in the First Trimester. Macy Mis J Med [phone]. These results were called by telephone at the time of interpretation on 04/17/2018 at 9:31 pm to [facility], nurse in MAU, who verbally acknowledged these results. Electronically Signed   By: Charline Bills M.D.   On: 04/17/2018 21:33    Assessment:  [redacted]w[redacted]d weeks gestation   Miscarriage   Plan: The patient is instructed to follow up in in 1 week for F/U HCG.  Gina Mora, MSN, CNM 04/22/2018, 3:31 PM

## 2018-04-23 LAB — BETA HCG QUANT (REF LAB): HCG QUANT: 268 m[IU]/mL

## 2018-04-29 ENCOUNTER — Ambulatory Visit: Payer: Self-pay | Admitting: Obstetrics and Gynecology

## 2018-04-29 ENCOUNTER — Other Ambulatory Visit: Payer: Self-pay

## 2018-04-30 ENCOUNTER — Other Ambulatory Visit: Payer: Medicaid Other

## 2018-04-30 DIAGNOSIS — O039 Complete or unspecified spontaneous abortion without complication: Secondary | ICD-10-CM | POA: Diagnosis not present

## 2018-04-30 NOTE — Progress Notes (Signed)
Patient here for lab only visit for weekly HCG levels after miscarriage.  [name], CNM

## 2018-05-01 LAB — BETA HCG QUANT (REF LAB): hCG Quant: 12 m[IU]/mL

## 2018-05-03 ENCOUNTER — Telehealth: Payer: Self-pay | Admitting: General Practice

## 2018-05-03 NOTE — Telephone Encounter (Signed)
Left message for patient to give our office a call back to schedule for repeat labs.  Message to via Mychart as well.

## 2018-05-06 ENCOUNTER — Encounter: Payer: Self-pay | Admitting: Obstetrics and Gynecology

## 2018-05-07 ENCOUNTER — Other Ambulatory Visit: Payer: Medicaid Other | Admitting: *Deleted

## 2018-05-07 DIAGNOSIS — O039 Complete or unspecified spontaneous abortion without complication: Secondary | ICD-10-CM | POA: Diagnosis not present

## 2018-05-07 NOTE — Progress Notes (Signed)
Patient is here for lab work only - bHCG.

## 2018-05-08 DIAGNOSIS — Z113 Encounter for screening for infections with a predominantly sexual mode of transmission: Secondary | ICD-10-CM | POA: Diagnosis not present

## 2018-05-08 DIAGNOSIS — Z114 Encounter for screening for human immunodeficiency virus [HIV]: Secondary | ICD-10-CM | POA: Diagnosis not present

## 2018-05-08 LAB — BETA HCG QUANT (REF LAB): HCG QUANT: 3 m[IU]/mL

## 2018-06-25 DIAGNOSIS — Z113 Encounter for screening for infections with a predominantly sexual mode of transmission: Secondary | ICD-10-CM | POA: Diagnosis not present

## 2018-07-20 ENCOUNTER — Encounter: Payer: Self-pay | Admitting: *Deleted

## 2018-07-20 ENCOUNTER — Other Ambulatory Visit: Payer: Medicaid Other | Admitting: *Deleted

## 2018-07-20 ENCOUNTER — Other Ambulatory Visit: Payer: Self-pay | Admitting: Obstetrics and Gynecology

## 2018-07-20 DIAGNOSIS — Z3200 Encounter for pregnancy test, result unknown: Secondary | ICD-10-CM

## 2018-07-20 DIAGNOSIS — O09299 Supervision of pregnancy with other poor reproductive or obstetric history, unspecified trimester: Secondary | ICD-10-CM

## 2018-07-20 DIAGNOSIS — Z3A01 Less than 8 weeks gestation of pregnancy: Secondary | ICD-10-CM

## 2018-07-20 LAB — BETA HCG QUANT (REF LAB): hCG Quant: 19641 m[IU]/mL

## 2018-07-20 MED ORDER — VITAFOL FE+ 90-1-200 & 50 MG PO CPPK: 3.0000 | ORAL_CAPSULE | Freq: Every day | ORAL | 12 refills | Status: DC

## 2018-07-20 NOTE — Progress Notes (Signed)
TC to patient to notify of HCG level of 19, 641. Per patient LMP was 06/13/2018, lasted 5 days (normal period). (+) HPT on 07/15/2018. Notified will need to get an U/S ASAP to confirm viability & location of pregnancy. Order placed for OB Complete < 14 wks U/S on Thursday 07/22/2018. Patient informed that scheduler from U/S department will call to get that scheduled for her. Advised patient [facility] office [facility] as soon as we have a confirmed viable IUP. Patient verbalized an understanding of the plan of care and agrees.    Rx for PNV sent to pharmacy on file.  Gina Hayden, CNM  07/20/2018 2:49 PM

## 2018-07-20 NOTE — Progress Notes (Signed)
   Patient in clinic for stat beta Hcg quant.  LMP 06/13/2018, denies any vaginal bleeding since last menses. Patient waited in clinic over two hours for stat result. [facility] was called; results are still pending. Will inform provider.  Clovis PuMartin, Ethal Gotay L, RN

## 2018-07-22 ENCOUNTER — Telehealth: Payer: Self-pay | Admitting: General Practice

## 2018-07-22 ENCOUNTER — Other Ambulatory Visit: Payer: Self-pay | Admitting: Obstetrics and Gynecology

## 2018-07-22 DIAGNOSIS — O09299 Supervision of pregnancy with other poor reproductive or obstetric history, unspecified trimester: Secondary | ICD-10-CM

## 2018-07-22 NOTE — Telephone Encounter (Signed)
Called and informed patient of [place] appointment scheduled for 07/28/18 at 3:45pm.  Patient verbalized understanding.

## 2018-07-23 DIAGNOSIS — Z114 Encounter for screening for human immunodeficiency virus [HIV]: Secondary | ICD-10-CM | POA: Diagnosis not present

## 2018-07-23 DIAGNOSIS — B373 Candidiasis of vulva and vagina: Secondary | ICD-10-CM | POA: Diagnosis not present

## 2018-07-23 DIAGNOSIS — Z113 Encounter for screening for infections with a predominantly sexual mode of transmission: Secondary | ICD-10-CM | POA: Diagnosis not present

## 2018-07-28 ENCOUNTER — Other Ambulatory Visit: Payer: Self-pay | Admitting: Obstetrics and Gynecology

## 2018-07-28 ENCOUNTER — Ambulatory Visit (HOSPITAL_COMMUNITY)
Admission: RE | Admit: 2018-07-28 | Discharge: 2018-07-28 | Disposition: A | Discharge status: Home / Self Care | Payer: Medicaid Other | Source: Ambulatory Visit | Attending: Obstetrics and Gynecology | Admitting: Obstetrics and Gynecology

## 2018-07-28 DIAGNOSIS — O09299 Supervision of pregnancy with other poor reproductive or obstetric history, unspecified trimester: Secondary | ICD-10-CM

## 2018-07-28 DIAGNOSIS — O208 Other hemorrhage in early pregnancy: Secondary | ICD-10-CM | POA: Diagnosis not present

## 2018-07-28 DIAGNOSIS — Z3A01 Less than 8 weeks gestation of pregnancy: Secondary | ICD-10-CM | POA: Diagnosis not present

## 2018-07-29 ENCOUNTER — Telehealth: Payer: Self-pay | Admitting: General Practice

## 2018-07-29 NOTE — Telephone Encounter (Signed)
Left message on VM for patient to give our office a call to schedule RN New OB and initial New OB appt.

## 2018-08-02 ENCOUNTER — Other Ambulatory Visit: Payer: Self-pay

## 2018-08-02 ENCOUNTER — Ambulatory Visit (INDEPENDENT_AMBULATORY_CARE_PROVIDER_SITE_OTHER): Payer: Medicaid Other | Admitting: *Deleted

## 2018-08-02 DIAGNOSIS — Z3480 Encounter for supervision of other normal pregnancy, unspecified trimester: Secondary | ICD-10-CM

## 2018-08-02 LAB — POCT URINALYSIS DIPSTICK OB
Bilirubin, UA: NEGATIVE
GLUCOSE, UA: NEGATIVE
Ketones, UA: NEGATIVE
Leukocytes, UA: NEGATIVE
Nitrite, UA: NEGATIVE
PH UA: 7 (ref 5.0–8.0)
RBC UA: NEGATIVE
Spec Grav, UA: 1.02 (ref 1.010–1.025)
UROBILINOGEN UA: 1 U/dL

## 2018-08-02 MED ORDER — ONE-A-DAY WOMENS PRENATAL 28-0.8 & 223 MG PO MISC: 1.0000 | Freq: Every day | ORAL | 12 refills | Status: DC

## 2018-08-02 NOTE — Progress Notes (Signed)
   PRENATAL INTAKE SUMMARY  Ms. Ledesma presents today New OB Nurse Interview.  OB History    Gravida  5   Para  2   Term  2   Preterm      AB  1   Living  2     SAB      TAB  1   Ectopic      Multiple      Live Births  2          I have reviewed the patient's medical, obstetrical, social, and family histories, medications, and available lab results.  SUBJECTIVE She has no unusual complaints.  OBJECTIVE Initial nurse interview for history and lab work (New OB). Recent miscarriage 2019.  EDD: 03/18/2019 confirmed by ultrasound on 07/28/2018 GA: [redacted]w[redacted]d [id number]  GENERAL APPEARANCE: alert, well appearing, in no apparent distress, oriented to person, place and time.   ASSESSMENT Normal pregnancy.  PLAN Prenatal care-[facility] Pnl/HIV  OB Urine Culture/Dip GC/CT/PAP at next visit with Midwife HgbEval SMA CF A1C PNV sent to pharmacy. Panorama at next visit. Flu at next visit.  Clovis Pu, RN

## 2018-08-04 LAB — CULTURE, OB URINE

## 2018-08-04 LAB — URINE CULTURE, OB REFLEX

## 2018-08-06 LAB — OBSTETRIC PANEL, INCLUDING HIV
ANTIBODY SCREEN: NEGATIVE
BASOS: 0 %
Basophils Absolute: 0 10*3/uL (ref 0.0–0.2)
EOS (ABSOLUTE): 0.1 10*3/uL (ref 0.0–0.4)
EOS: 1 %
HEMATOCRIT: 37.1 % (ref 34.0–46.6)
HEMOGLOBIN: 12.5 g/dL (ref 11.1–15.9)
HIV Screen 4th Generation wRfx: NONREACTIVE
Hepatitis B Surface Ag: NEGATIVE
IMMATURE GRANS (ABS): 0 10*3/uL (ref 0.0–0.1)
Immature Granulocytes: 0 %
LYMPHS: 27 %
Lymphocytes Absolute: 1.9 10*3/uL (ref 0.7–3.1)
MCH: 29.1 pg (ref 26.6–33.0)
MCHC: 33.7 g/dL (ref 31.5–35.7)
MCV: 87 fL (ref 79–97)
MONOCYTES: 7 %
Monocytes Absolute: 0.5 10*3/uL (ref 0.1–0.9)
Neutrophils Absolute: 4.6 10*3/uL (ref 1.4–7.0)
Neutrophils: 65 %
Platelets: 304 10*3/uL (ref 150–450)
RBC: 4.29 x10E6/uL (ref 3.77–5.28)
RDW: 12.1 % (ref 11.7–15.4)
RPR Ser Ql: NONREACTIVE
Rh Factor: POSITIVE
Rubella Antibodies, IGG: 11.5 index (ref >0.99)
WBC: 7.1 10*3/uL (ref 3.4–10.8)

## 2018-08-06 LAB — HEMOGLOBINOPATHY EVALUATION
Ferritin: 24 ng/mL (ref 15–150)
HGB A2 QUANT: 2 % (ref 1.8–3.2)
HGB A: 98 % (ref 96.4–98.8)
HGB C: 0 %
HGB S: 0 %
HGB SOLUBILITY: NEGATIVE
HGB VARIANT: 0 %
Hgb F Quant: 0 % (ref 0.0–2.0)

## 2018-08-06 LAB — HEMOGLOBIN A1C
Est. average glucose Bld gHb Est-mCnc: 103 mg/dL
Hgb A1c MFr Bld: 5.2 % (ref 4.8–5.6)

## 2018-08-06 LAB — CYSTIC FIBROSIS MUTATION 97: GENE DIS ANAL CARRIER INTERP BLD/T-IMP: NOT DETECTED

## 2018-08-10 ENCOUNTER — Encounter: Payer: Self-pay | Admitting: Obstetrics and Gynecology

## 2018-08-10 DIAGNOSIS — O09299 Supervision of pregnancy with other poor reproductive or obstetric history, unspecified trimester: Secondary | ICD-10-CM | POA: Insufficient documentation

## 2018-08-10 LAB — SMN1 COPY NUMBER ANALYSIS (SMA CARRIER SCREENING)

## 2018-08-26 ENCOUNTER — Other Ambulatory Visit (HOSPITAL_COMMUNITY)
Admission: RE | Admit: 2018-08-26 | Discharge: 2018-08-26 | Disposition: A | Discharge status: Home / Self Care | Payer: Medicaid Other | Source: Ambulatory Visit | Attending: Obstetrics and Gynecology | Admitting: Obstetrics and Gynecology

## 2018-08-26 ENCOUNTER — Ambulatory Visit (INDEPENDENT_AMBULATORY_CARE_PROVIDER_SITE_OTHER): Payer: Medicaid Other | Admitting: Obstetrics and Gynecology

## 2018-08-26 ENCOUNTER — Encounter: Payer: Self-pay | Admitting: Obstetrics and Gynecology

## 2018-08-26 ENCOUNTER — Encounter: Payer: Self-pay | Admitting: General Practice

## 2018-08-26 VITALS — BP 135/87 | HR 109 | Wt 139.4 lb

## 2018-08-26 DIAGNOSIS — Z3480 Encounter for supervision of other normal pregnancy, unspecified trimester: Secondary | ICD-10-CM | POA: Insufficient documentation

## 2018-08-26 DIAGNOSIS — O09299 Supervision of pregnancy with other poor reproductive or obstetric history, unspecified trimester: Secondary | ICD-10-CM

## 2018-08-26 DIAGNOSIS — Z124 Encounter for screening for malignant neoplasm of cervix: Secondary | ICD-10-CM | POA: Diagnosis not present

## 2018-08-26 DIAGNOSIS — Z23 Encounter for immunization: Secondary | ICD-10-CM | POA: Diagnosis not present

## 2018-08-26 DIAGNOSIS — Z3481 Encounter for supervision of other normal pregnancy, first trimester: Secondary | ICD-10-CM

## 2018-08-26 DIAGNOSIS — O09291 Supervision of pregnancy with other poor reproductive or obstetric history, first trimester: Secondary | ICD-10-CM

## 2018-08-26 MED ORDER — DOXYLAMINE-PYRIDOXINE 10-10 MG PO TBEC: 1.0000 | DELAYED_RELEASE_TABLET | ORAL | 3 refills | Status: DC

## 2018-08-26 NOTE — Progress Notes (Signed)
  Subjective:    ABBIGAILE LEPAGE is being seen today for her first obstetrical visit.  This is an unplanned but desired pregnancy. She is at [redacted]w[redacted]d gestation. Her obstetrical history is significant for no issues with her previous term pregnancies, but has had one recent miscarriage at 10w. Relationship with FOB (Ron): spouse, living together. Patient does intend to breast feed. Wants postpartum BTL. Pregnancy history fully reviewed.  Patient reports nausea.  Review of Systems:   Review of Systems  Constitutional: Negative.  Negative for fatigue and fever.  HENT: Negative.   Eyes: Negative.   Respiratory: Negative.  Negative for chest tightness and shortness of breath.   Cardiovascular: Negative.  Negative for chest pain, palpitations and leg swelling.  Gastrointestinal: Positive for nausea and vomiting (3-4x/day). Negative for abdominal distention, abdominal pain and constipation.  Endocrine: Negative.   Genitourinary: Negative.  Negative for pelvic pain, vaginal bleeding and vaginal discharge.  Musculoskeletal: Negative.  Negative for back pain.  Skin: Negative.  Negative for rash.  Allergic/Immunologic: Negative.   Neurological: Negative.  Negative for dizziness and headaches.  Hematological: Negative.   Psychiatric/Behavioral: Negative.     Objective:     BP 135/87   Pulse (!) 109   Wt 63.2 kg   LMP 06/13/2018   BMI 26.34 kg/m  Physical Exam  Nursing note and vitals reviewed. Constitutional: She is oriented to person, place, and time. She appears well-developed and well-nourished. No distress.  HENT:  Head: Normocephalic.  Eyes: Pupils are equal, round, and reactive to light.  Neck: Normal range of motion.  Cardiovascular: Normal rate, regular rhythm and normal heart sounds.  No murmur heard. Respiratory: Effort normal and breath sounds normal. No respiratory distress.  GI: Soft. Bowel sounds are normal. She exhibits no distension. There is no abdominal tenderness.   Genitourinary:    Vagina and uterus normal.     No vaginal discharge.   Musculoskeletal: Normal range of motion.  Neurological: She is alert and oriented to person, place, and time.  Skin: Skin is warm and dry. She is not diaphoretic.  Psychiatric: She has a normal mood and affect. Her behavior is normal. Judgment and thought content normal.    Maternal Exam:  Introitus: Vagina is negative for discharge.       Assessment:    Pregnancy: [id number] 1. Supervision of other normal pregnancy, antepartum 2. History of miscarriage, currently pregnant 3. Pap smear for cervical cancer screening 4. Need for immunization against influenza   Plan:     Initial labs reviewed, Panorama drawn. OTC prenatal vitamins being taken. Prescription for Diclegis sent to pharmacy. Flu shot given. Cytology - PAP([place]) collected. Problem list reviewed and updated. AFP3 discussed: requested. Role of ultrasound in pregnancy discussed; fetal survey: requested. Discussed timing of follow-up ultrasound for [facility] (at anatomy scan unless otherwise indicated). Amniocentesis discussed: not indicated. Reviewed the nature of [facility] with multiple MDs and other Advanced Practice Providers was explained to patient; also emphasized that residents, students are part of our team. Follow up in 4 weeks. 50% of 40 min visit spent on counseling and coordination of care.    Bernerd Limbo, SNM 08/26/2018

## 2018-08-29 LAB — CYTOLOGY - PAP
BACTERIAL VAGINITIS: POSITIVE — AB
CANDIDA VAGINITIS: NEGATIVE
Chlamydia: NEGATIVE
DIAGNOSIS: NEGATIVE
HPV (WINDOPATH): NOT DETECTED
NEISSERIA GONORRHEA: NEGATIVE
Trichomonas: NEGATIVE

## 2018-08-30 ENCOUNTER — Telehealth: Payer: Self-pay | Admitting: *Deleted

## 2018-08-30 DIAGNOSIS — N760 Acute vaginitis: Principal | ICD-10-CM

## 2018-08-30 DIAGNOSIS — B9689 Other specified bacterial agents as the cause of diseases classified elsewhere: Secondary | ICD-10-CM

## 2018-08-30 MED ORDER — METRONIDAZOLE 0.75 % VA GEL: 1.0000 | Freq: Every day | VAGINAL | 0 refills | Status: AC

## 2018-08-30 NOTE — Telephone Encounter (Signed)
-----   Message from [place], [place] sent at 08/30/2018  1:30 PM EST ----- Please treat with Metrogel, since she has moderate N/V

## 2018-09-06 ENCOUNTER — Encounter: Payer: Self-pay | Admitting: General Practice

## 2018-09-09 ENCOUNTER — Encounter: Payer: Self-pay | Admitting: General Practice

## 2018-09-23 ENCOUNTER — Other Ambulatory Visit: Payer: Self-pay

## 2018-09-23 ENCOUNTER — Ambulatory Visit (INDEPENDENT_AMBULATORY_CARE_PROVIDER_SITE_OTHER): Payer: Medicaid Other | Admitting: Obstetrics and Gynecology

## 2018-09-23 ENCOUNTER — Encounter: Payer: Self-pay | Admitting: Obstetrics and Gynecology

## 2018-09-23 VITALS — BP 135/84 | HR 101 | Wt 146.4 lb

## 2018-09-23 DIAGNOSIS — Z3A14 14 weeks gestation of pregnancy: Secondary | ICD-10-CM

## 2018-09-23 DIAGNOSIS — Z3480 Encounter for supervision of other normal pregnancy, unspecified trimester: Secondary | ICD-10-CM

## 2018-09-23 DIAGNOSIS — O09299 Supervision of pregnancy with other poor reproductive or obstetric history, unspecified trimester: Secondary | ICD-10-CM

## 2018-09-23 DIAGNOSIS — O09292 Supervision of pregnancy with other poor reproductive or obstetric history, second trimester: Secondary | ICD-10-CM

## 2018-09-23 NOTE — Progress Notes (Signed)
   PRENATAL VISIT NOTE  Subjective:  Gina Hayden is a 38 y.o. [id number] at [redacted]w[redacted]d being seen today for ongoing prenatal care.  She is currently monitored for the following issues for this low-risk pregnancy and has Seasonal allergic rhinitis; Supervision of other normal pregnancy, antepartum; and History of miscarriage, currently pregnant on their problem list.  Patient reports no complaints.  Contractions: Not present. Vag. Bleeding: None.  Movement: Absent. Denies leaking of fluid.   The following portions of the patient's history were reviewed and updated as appropriate: allergies, current medications, past family history, past medical history, past social history, past surgical history and problem list. Problem list updated.  Objective:   Vitals:   09/23/18 1357  BP: 135/84  Pulse: (!) 101  Weight: 66.4 kg    Fetal Status: Fetal Heart Rate (bpm): 150 Fundal Height: 14 cm Movement: Absent     General:  Alert, oriented and cooperative. Patient is in no acute distress.  Skin: Skin is warm and dry. No rash noted.   Cardiovascular: Normal heart rate noted  Respiratory: Normal respiratory effort, no problems with respiration noted  Abdomen: Soft, gravid, appropriate for gestational age.  Pain/Pressure: Absent     Pelvic: Cervical exam deferred        Extremities: Normal range of motion.  Edema: None  Mental Status: Normal mood and affect. Normal behavior. Normal judgment and thought content.   Assessment and Plan:  Pregnancy: [id number] at [redacted]w[redacted]d  1. Supervision of other normal pregnancy, antepartum - Discussed appropriate weight gain and nutrition in pregnancy  2. History of miscarriage, currently pregnant - [place] MFM OB DETAIL +14 WK; Future  Preterm labor symptoms and general obstetric precautions including but not limited to vaginal bleeding, contractions, leaking of fluid and fetal movement were reviewed in detail with the patient. Please refer to After Visit Summary for other  counseling recommendations.  Return in about 4 weeks (around 10/21/2018) for ROB + 18wk [place].  Future Appointments  Date Time Provider Department Center  10/21/2018  1:50 PM [name], CNM [facility] None    Bernerd Limbo, Student-MidWife

## 2018-10-21 ENCOUNTER — Encounter: Payer: Self-pay | Admitting: Obstetrics and Gynecology

## 2018-10-21 ENCOUNTER — Telehealth: Payer: Self-pay | Admitting: General Practice

## 2018-10-21 NOTE — Telephone Encounter (Signed)
Called patient at 1353 and at 1355;no answer to conduct Telehealth visit.  Unable to leave message on VM d/t VM not set up.

## 2018-10-25 ENCOUNTER — Ambulatory Visit (HOSPITAL_COMMUNITY): Payer: Medicaid Other

## 2018-10-25 ENCOUNTER — Ambulatory Visit (HOSPITAL_COMMUNITY): Payer: Medicaid Other | Attending: Obstetrics and Gynecology

## 2018-11-01 DIAGNOSIS — Z30017 Encounter for initial prescription of implantable subdermal contraceptive: Secondary | ICD-10-CM | POA: Diagnosis not present

## 2018-11-15 DIAGNOSIS — Z3202 Encounter for pregnancy test, result negative: Secondary | ICD-10-CM | POA: Diagnosis not present

## 2018-11-15 DIAGNOSIS — R102 Pelvic and perineal pain: Secondary | ICD-10-CM | POA: Diagnosis not present

## 2018-11-15 DIAGNOSIS — B373 Candidiasis of vulva and vagina: Secondary | ICD-10-CM | POA: Diagnosis not present

## 2018-11-15 DIAGNOSIS — Z114 Encounter for screening for human immunodeficiency virus [HIV]: Secondary | ICD-10-CM | POA: Diagnosis not present

## 2018-11-15 DIAGNOSIS — Z113 Encounter for screening for infections with a predominantly sexual mode of transmission: Secondary | ICD-10-CM | POA: Diagnosis not present

## 2018-11-24 ENCOUNTER — Encounter: Payer: Self-pay | Admitting: General Practice

## 2018-12-30 ENCOUNTER — Encounter: Payer: Self-pay | Admitting: General Practice

## 2019-01-29 DIAGNOSIS — Z3046 Encounter for surveillance of implantable subdermal contraceptive: Secondary | ICD-10-CM | POA: Diagnosis not present

## 2019-04-26 DIAGNOSIS — N898 Other specified noninflammatory disorders of vagina: Secondary | ICD-10-CM | POA: Diagnosis not present

## 2019-04-26 DIAGNOSIS — R300 Dysuria: Secondary | ICD-10-CM | POA: Diagnosis not present

## 2019-04-26 DIAGNOSIS — Z113 Encounter for screening for infections with a predominantly sexual mode of transmission: Secondary | ICD-10-CM | POA: Diagnosis not present

## 2019-06-10 ENCOUNTER — Encounter (HOSPITAL_COMMUNITY): Payer: Self-pay

## 2019-07-12 ENCOUNTER — Ambulatory Visit: Payer: Medicaid Other | Attending: Internal Medicine

## 2019-07-12 DIAGNOSIS — Z20828 Contact with and (suspected) exposure to other viral communicable diseases: Secondary | ICD-10-CM | POA: Diagnosis not present

## 2019-07-12 DIAGNOSIS — Z20822 Contact with and (suspected) exposure to covid-19: Secondary | ICD-10-CM

## 2019-07-13 ENCOUNTER — Other Ambulatory Visit: Payer: Medicaid Other

## 2019-07-13 LAB — NOVEL CORONAVIRUS, NAA: SARS-CoV-2, NAA: NOT DETECTED

## 2019-09-16 DIAGNOSIS — Z113 Encounter for screening for infections with a predominantly sexual mode of transmission: Secondary | ICD-10-CM | POA: Diagnosis not present

## 2019-10-01 ENCOUNTER — Ambulatory Visit: Payer: Medicaid Other | Attending: Internal Medicine

## 2019-10-01 DIAGNOSIS — Z23 Encounter for immunization: Secondary | ICD-10-CM

## 2019-10-01 NOTE — Progress Notes (Signed)
   Covid-19 Vaccination Clinic  Name:  Gina Hayden    MRN: [id number] DOB: 1980/08/18  10/01/2019  [name] was observed post Covid-19 immunization for 15 minutes without incident. She was provided with Vaccine Information Sheet and instruction to access the V-Safe system.   [name] was instructed to call 911 with any severe reactions post vaccine: Marland Kitchen Difficulty breathing  . Swelling of face and throat  . A fast heartbeat  . A bad rash all over body  . Dizziness and weakness   Immunizations Administered    Name Date Dose VIS Date Route   Pfizer COVID-19 Vaccine 10/01/2019  2:44 PM 0.3 mL 07/01/2019 Intramuscular   Manufacturer: [employer], [employer]   Lot: [id number]   NDC: [address]-6

## 2019-10-25 ENCOUNTER — Ambulatory Visit: Payer: Medicaid Other | Attending: Internal Medicine

## 2019-10-25 DIAGNOSIS — Z23 Encounter for immunization: Secondary | ICD-10-CM

## 2019-10-25 NOTE — Progress Notes (Signed)
   Covid-19 Vaccination Clinic  Name:  Gina Hayden    MRN: [id number] DOB: 1981-03-19  10/25/2019  [name] was observed post Covid-19 immunization for 15 minutes without incident. She was provided with Vaccine Information Sheet and instruction to access the V-Safe system.   [name] was instructed to call 911 with any severe reactions post vaccine: Marland Kitchen Difficulty breathing  . Swelling of face and throat  . A fast heartbeat  . A bad rash all over body  . Dizziness and weakness   Immunizations Administered    Name Date Dose VIS Date Route   Pfizer COVID-19 Vaccine 10/25/2019  3:14 PM 0.3 mL 07/01/2019 Intramuscular   Manufacturer: [employer], [employer]   Lot: [id number]   NDC: [address]-3

## 2019-10-31 ENCOUNTER — Emergency Department (HOSPITAL_COMMUNITY)
Admission: EM | Admit: 2019-10-31 | Discharge: 2019-10-31 | Disposition: A | Discharge status: Home / Self Care | Payer: Medicaid Other | Attending: Emergency Medicine | Admitting: Emergency Medicine

## 2019-10-31 ENCOUNTER — Emergency Department (HOSPITAL_COMMUNITY): Payer: Medicaid Other

## 2019-10-31 ENCOUNTER — Encounter (HOSPITAL_COMMUNITY): Payer: Self-pay | Admitting: Emergency Medicine

## 2019-10-31 ENCOUNTER — Other Ambulatory Visit: Payer: Self-pay

## 2019-10-31 DIAGNOSIS — Y999 Unspecified external cause status: Secondary | ICD-10-CM | POA: Diagnosis not present

## 2019-10-31 DIAGNOSIS — Y92019 Unspecified place in single-family (private) house as the place of occurrence of the external cause: Secondary | ICD-10-CM | POA: Insufficient documentation

## 2019-10-31 DIAGNOSIS — Y939 Activity, unspecified: Secondary | ICD-10-CM | POA: Diagnosis not present

## 2019-10-31 DIAGNOSIS — S8991XA Unspecified injury of right lower leg, initial encounter: Secondary | ICD-10-CM | POA: Diagnosis present

## 2019-10-31 DIAGNOSIS — W1830XA Fall on same level, unspecified, initial encounter: Secondary | ICD-10-CM | POA: Insufficient documentation

## 2019-10-31 DIAGNOSIS — Z79899 Other long term (current) drug therapy: Secondary | ICD-10-CM | POA: Diagnosis not present

## 2019-10-31 DIAGNOSIS — M25561 Pain in right knee: Secondary | ICD-10-CM | POA: Diagnosis not present

## 2019-10-31 DIAGNOSIS — M25461 Effusion, right knee: Secondary | ICD-10-CM | POA: Diagnosis not present

## 2019-10-31 MED ORDER — NAPROXEN 500 MG PO TABS: 500.0000 mg | ORAL_TABLET | Freq: Two times a day (BID) | ORAL | 0 refills | Status: DC | PRN

## 2019-10-31 MED ORDER — NAPROXEN 250 MG PO TABS
500.0000 mg | ORAL_TABLET | Freq: Once | ORAL | Status: AC
  Administered 2019-10-31: 500 mg via ORAL
  Filled 2019-10-31: qty 2

## 2019-10-31 NOTE — ED Triage Notes (Signed)
Patient lost her balance and fell at home this evening , patient stated she felt "a pop" at her right knee with pain and mild swelling , ambulatory/denies LOC.

## 2019-10-31 NOTE — ED Provider Notes (Signed)
[facility] EMERGENCY DEPARTMENT Provider Note   CSN: [id number] Arrival date & time: 10/31/19  0100     History Chief Complaint  Patient presents with  . Knee Injury    Gina Hayden is a 39 y.o. female.   The history is provided by the patient. No language interpreter was used.  Knee Pain Location:  Knee Time since incident:  8 hours Injury: yes   Mechanism of injury comment:  Larey Seat and felt a "pop" in the R knee followed by pain Knee location:  R knee Pain details:    Quality:  Sharp   Radiates to:  Does not radiate   Severity:  Moderate   Onset quality:  Sudden   Timing:  Constant   Progression:  Unchanged Chronicity:  New Dislocation: no   Prior injury to area:  Yes (Hx of prior meniscal injury) Relieved by:  Nothing Worsened by:  Activity, bearing weight and flexion Ineffective treatments: knee sleeve. Associated symptoms: decreased ROM and swelling   Associated symptoms: no fever, no numbness and no tingling   Risk factors: no frequent fractures, no known bone disorder and no obesity        Past Medical History:  Diagnosis Date  . Medical history non-contributory   . No pertinent past medical history     Patient Active Problem List   Diagnosis Date Noted  . History of miscarriage, currently pregnant 08/10/2018  . Supervision of other normal pregnancy, antepartum 08/02/2018  . Seasonal allergic rhinitis 11/12/2011    Past Surgical History:  Procedure Laterality Date  . DILATION AND CURETTAGE OF UTERUS     abortion  . WISDOM TOOTH EXTRACTION       OB History    Gravida  5   Para  2   Term  2   Preterm      AB  2   Living  2     SAB  1   TAB  1   Ectopic      Multiple      Live Births  2           Family History  Problem Relation Age of Onset  . Hypertension Mother   . Hyperlipidemia Mother   . Hypertension Sister   . Hyperlipidemia Sister   . Stroke Father   . Stroke Maternal Grandmother   .  Diabetes Mellitus I Paternal Grandmother   . Diabetes Paternal Grandmother     Social History   Tobacco Use  . Smoking status: Never Smoker  . Smokeless tobacco: Never Used  Substance Use Topics  . Alcohol use: Yes    Comment: Socially  . Drug use: No    Home Medications Prior to Admission medications   Medication Sig Start Date End Date Taking? Authorizing Provider  acetaminophen (TYLENOL) 325 MG tablet Take 650 mg by mouth every 6 (six) hours as needed.    [provider]  Calcium Carbonate-Vit D-Min (CALCIUM 1200 PO) Take by mouth.    [provider]  Doxylamine-Pyridoxine 10-10 MG TBEC Take 1 tablet by mouth as directed. Take 2 tablets at bedtime. May add 1 tablet each morning starting on Day 3 if symptoms persist. May add additional tablet at lunchtime on Day 4 if symptoms persist. Patient not taking: Reported on 09/23/2018 08/26/18   [name], CNM  Multiple Vitamin (MULTIVITAMIN) tablet Take 1 tablet by mouth daily.    [provider]  naproxen (NAPROSYN) 500 MG tablet Take 1  tablet (500 mg total) by mouth every 12 (twelve) hours as needed for mild pain or moderate pain. 10/31/19   Antonietta Breach, PA-C  Prenat-FePoly-Metf-FA-DHA-DSS (VITAFOL FE+) 90-1-200 & 50 MG CPPK Take 3 tablets by mouth daily. Patient not taking: Reported on 08/02/2018 07/20/18   Laury Deep, CNM  Prenatal Vit-Fe Fum-FA-Omega (ONE-A-DAY WOMENS PRENATAL) 28-0.8 & 223 MG MISC Take 1 tablet by mouth daily. 08/02/18   Laury Deep, CNM    Allergies    Patient has no known allergies.  Review of Systems   Review of Systems  Constitutional: Negative for fever.  Musculoskeletal: Positive for arthralgias.  Ten systems reviewed and are negative for acute change, except as noted in the HPI.    Physical Exam Updated Vital Signs BP 131/89 (BP Location: Left Arm)   Pulse (!) 105   Temp 98.2 F (36.8 C) (Oral)   Resp 18   Ht 5\' 1"  (1.549 m)   Wt 72 kg   SpO2 99%   BMI 29.99  kg/m   Physical Exam Vitals and nursing note reviewed.  Constitutional:      General: She is not in acute distress.    Appearance: She is well-developed. She is not diaphoretic.     Comments: Nontoxic appearing and in NAD  HENT:     Head: Normocephalic and atraumatic.  Eyes:     General: No scleral icterus.    Conjunctiva/sclera: Conjunctivae normal.  Cardiovascular:     Rate and Rhythm: Normal rate and regular rhythm.     Pulses: Normal pulses.     Comments: DP pulse 2+ in the RLE Pulmonary:     Effort: Pulmonary effort is normal. No respiratory distress.     Comments: Respirations even and unlabored Musculoskeletal:        General: Normal range of motion.     Cervical back: Normal range of motion.     Comments: Mild soft tissue swelling to the medial R knee with TTP. No crepitus or deformity. No palpable effusion, erythema, heat to touch. Limited flexion of the R knee 2/2 cooperation and pain.  Skin:    General: Skin is warm and dry.     Coloration: Skin is not pale.     Findings: No erythema or rash.  Neurological:     Mental Status: She is alert and oriented to person, place, and time.     Coordination: Coordination normal.     Comments: Sensation to light touch intact in the right lower extremity.  Patient able to wiggle toes.  Psychiatric:        Behavior: Behavior normal.     ED Results / Procedures / Treatments   Labs (all labs ordered are listed, but only abnormal results are displayed) Labs Reviewed - No data to display  EKG None  Radiology DG Knee Complete 4 Views Right  Result Date: 10/31/2019 CLINICAL DATA:  Fall with injury and pain EXAM: RIGHT KNEE - COMPLETE 4+ VIEW COMPARISON:  None. FINDINGS: No fracture or malalignment. Trace knee effusion. Joint spaces are normal IMPRESSION: No acute osseous abnormality.  Trace knee effusion Electronically Signed   By: Donavan Foil M.D.   On: 10/31/2019 02:01    Procedures Procedures (including critical care  time)  Medications Ordered in ED Medications  naproxen (NAPROSYN) tablet 500 mg (has no administration in time range)    ED Course  I have reviewed the triage vital signs and the nursing notes.  Pertinent labs & imaging results that were available during  my care of the patient were reviewed by me and considered in my medical decision making (see chart for details).    MDM Rules/Calculators/A&P                      Patient presents to the emergency department for evaluation of R knee pain. Patient neurovascularly intact on exam. Imaging negative for fracture, dislocation, bony deformity. No erythema, heat to touch to the affected area; no concern for septic joint. Compartments in the affected extremity are soft. Plan for supportive management including RICE and NSAIDs; primary care follow up as needed. Return precautions discussed and provided. Patient discharged in stable condition with no unaddressed concerns.   Final Clinical Impression(s) / ED Diagnoses Final diagnoses:  Acute pain of right knee    Rx / DC Orders ED Discharge Orders         Ordered    naproxen (NAPROSYN) 500 MG tablet  Every 12 hours PRN     10/31/19 0550           Antony Madura, PA-C 10/31/19 0557    Zadie Rhine, MD 11/01/19 0800

## 2019-10-31 NOTE — Discharge Instructions (Addendum)
Apply ice to areas of injury 3-4 times per day to limit inflammation.  Avoid strenuous activity and heavy lifting.  We recommend consistent use of naproxen for pain and inflammation management.  Use crutches to prevent from putting weight on your right leg.  We recommend follow-up with a primary care doctor to ensure resolution of symptoms.  You may see an orthopedist, if desired, for repeat evaluation.  Return to the ED for any new or concerning symptoms.

## 2019-11-04 ENCOUNTER — Other Ambulatory Visit: Payer: Self-pay

## 2019-11-04 ENCOUNTER — Emergency Department (HOSPITAL_COMMUNITY): Payer: Medicaid Other

## 2019-11-04 ENCOUNTER — Emergency Department (HOSPITAL_COMMUNITY)
Admission: EM | Admit: 2019-11-04 | Discharge: 2019-11-04 | Disposition: A | Discharge status: Home / Self Care | Payer: Medicaid Other | Attending: Emergency Medicine | Admitting: Emergency Medicine

## 2019-11-04 ENCOUNTER — Encounter (HOSPITAL_COMMUNITY): Payer: Self-pay | Admitting: Emergency Medicine

## 2019-11-04 DIAGNOSIS — M25561 Pain in right knee: Secondary | ICD-10-CM | POA: Diagnosis not present

## 2019-11-04 DIAGNOSIS — Z79899 Other long term (current) drug therapy: Secondary | ICD-10-CM | POA: Insufficient documentation

## 2019-11-04 DIAGNOSIS — S8991XA Unspecified injury of right lower leg, initial encounter: Secondary | ICD-10-CM | POA: Diagnosis not present

## 2019-11-04 NOTE — ED Provider Notes (Signed)
[facility]-EMERGENCY DEPT Provider Note   CSN: [id number] Arrival date & time: 11/04/19  1704     History Chief Complaint  Patient presents with  . Knee Pain    Gina Hayden is a 39 y.o. female.  HPI   Patient is a 39 year old female presenting for evaluation of right knee pain.  States that she was in her kitchen a few days ago cooking when she turned wrong and felt a pop in her right knee on the medial aspect.  Since then she has had pain and swelling to the right knee.  She has had some decreased range of motion secondary to pain but she has been ambulatory.  She also has some swelling to the right lower extremity and bruising.  She denies any numbness to the right lower extremity.  She denies any other symptoms or injuries from the fall.  Reviewed records.  Patient was actually seen in the ED a few days ago for similar complaints.  She had a negative x-ray at that time and was told to follow-up with her PCP.  Past Medical History:  Diagnosis Date  . Medical history non-contributory   . No pertinent past medical history     Patient Active Problem List   Diagnosis Date Noted  . History of miscarriage, currently pregnant 08/10/2018  . Supervision of other normal pregnancy, antepartum 08/02/2018  . Seasonal allergic rhinitis 11/12/2011    Past Surgical History:  Procedure Laterality Date  . DILATION AND CURETTAGE OF UTERUS     abortion  . WISDOM TOOTH EXTRACTION       OB History    Gravida  5   Para  2   Term  2   Preterm      AB  2   Living  2     SAB  1   TAB  1   Ectopic      Multiple      Live Births  2           Family History  Problem Relation Age of Onset  . Hypertension Mother   . Hyperlipidemia Mother   . Hypertension Sister   . Hyperlipidemia Sister   . Stroke Father   . Stroke Maternal Grandmother   . Diabetes Mellitus I Paternal Grandmother   . Diabetes Paternal Grandmother     Social History    Tobacco Use  . Smoking status: Never Smoker  . Smokeless tobacco: Never Used  Substance Use Topics  . Alcohol use: Yes    Comment: Socially  . Drug use: No    Home Medications Prior to Admission medications   Medication Sig Start Date End Date Taking? Authorizing Provider  acetaminophen (TYLENOL) 325 MG tablet Take 650 mg by mouth every 6 (six) hours as needed.    [provider]  Calcium Carbonate-Vit D-Min (CALCIUM 1200 PO) Take by mouth.    [provider]  Doxylamine-Pyridoxine 10-10 MG TBEC Take 1 tablet by mouth as directed. Take 2 tablets at bedtime. May add 1 tablet each morning starting on Day 3 if symptoms persist. May add additional tablet at lunchtime on Day 4 if symptoms persist. Patient not taking: Reported on 09/23/2018 08/26/18   [name], CNM  Multiple Vitamin (MULTIVITAMIN) tablet Take 1 tablet by mouth daily.    [provider]  naproxen (NAPROSYN) 500 MG tablet Take 1 tablet (500 mg total) by mouth every 12 (twelve) hours as needed for mild pain or moderate  pain. 10/31/19   Antony Madura, PA-C  Prenat-FePoly-Metf-FA-DHA-DSS (VITAFOL FE+) 90-1-200 & 50 MG CPPK Take 3 tablets by mouth daily. Patient not taking: Reported on 08/02/2018 07/20/18   [name], CNM  Prenatal Vit-Fe Fum-FA-Omega (ONE-A-DAY WOMENS PRENATAL) 28-0.8 & 223 MG MISC Take 1 tablet by mouth daily. 08/02/18   [name], CNM    Allergies    Patient has no known allergies.  Review of Systems   Review of Systems  Musculoskeletal:       Right knee pain, right leg swelling  Skin: Positive for color change.  Neurological: Negative for weakness and numbness.    Physical Exam Updated Vital Signs BP 113/89 (BP Location: Left Arm)   Pulse (!) 101   Temp 99 F (37.2 C) (Oral)   Wt 63 kg   LMP 10/04/2019 (Approximate)   SpO2 100%   BMI 26.26 kg/m   Physical Exam Vitals and nursing note reviewed.  Constitutional:      General: She is not in acute  distress.    Appearance: She is well-developed.  HENT:     Head: Normocephalic and atraumatic.  Eyes:     Conjunctiva/sclera: Conjunctivae normal.  Cardiovascular:     Rate and Rhythm: Normal rate.  Pulmonary:     Effort: Pulmonary effort is normal.  Musculoskeletal:     Cervical back: Neck supple.     Comments: TTP and swelling to the right knee. Old ecchymosis noted to the right knee and to the medial aspect of the lower leg. No calf TTP. No erythema to the knee. No joint effusion on exam. Normal distal pulses. Normal sensation. No joint laxity on exam.   Skin:    General: Skin is warm and dry.  Neurological:     Mental Status: She is alert.     ED Results / Procedures / Treatments   Labs (all labs ordered are listed, but only abnormal results are displayed) Labs Reviewed - No data to display  EKG None  Radiology DG Knee Complete 4 Views Right  Result Date: 11/04/2019 CLINICAL DATA:  Recent injury several days ago with persistent pain, initial encounter EXAM: RIGHT KNEE - COMPLETE 4+ VIEW COMPARISON:  10/31/2019 FINDINGS: No evidence of fracture, dislocation, or joint effusion. No evidence of arthropathy or other focal bone abnormality. Soft tissues are unremarkable. IMPRESSION: No acute abnormality noted. Electronically Signed   By: Alcide Clever M.D.   On: 11/04/2019 17:54    Procedures Procedures (including critical care time)  Medications Ordered in ED Medications - No data to display  ED Course  I have reviewed the triage vital signs and the nursing notes.  Pertinent labs & imaging results that were available during my care of the patient were reviewed by me and considered in my medical decision making (see chart for details).    MDM Rules/Calculators/A&P                     Patient is a 39 year old female presenting for evaluation of right knee pain.  States that she was in her kitchen a few days ago cooking when she turned wrong and felt a pop in her right knee  on the medial aspect.  Since then she has had pain and swelling to the right knee.  She has had some decreased range of motion secondary to pain but she has been ambulatory.  She also has some swelling to the right lower extremity and bruising.  She denies any numbness to the  right lower extremity.  She denies any other symptoms or injuries from the fall.  Reviewed records.  Patient was actually seen in the ED a few days ago for similar complaints.  She had a negative x-ray at that time and was told to follow-up with her PCP.  X-ray negative for any acute fracture or abnormality.  There is no joint effusion.  She does not appear to have any evidence of septic arthritis on exam.  There is no calf tenderness.  Normal distal pulses and normal sensation to the right lower extremity.  There is no joint laxity.  Will give knee immobilizer and crutches for comfort.  Will give follow-up with Ortho.  Advised anti-inflammatories, elevation to help reduce the swelling.  Advised on return precautions.  Final Clinical Impression(s) / ED Diagnoses Final diagnoses:  Acute pain of right knee    Rx / DC Orders ED Discharge Orders    None       Bishop Dublin 11/04/19 [place], [place], MD 11/14/19 [phone]

## 2019-11-04 NOTE — Discharge Instructions (Signed)
You may alternate taking Tylenol and Ibuprofen as needed for pain control. You may take 400-600 mg of ibuprofen every 6 hours and [phone] mg of Tylenol every 6 hours. Do not exceed 4000 mg of Tylenol daily as this can lead to liver damage. Also, make sure to take Ibuprofen with meals as it can cause an upset stomach. Do not take other NSAIDs while taking Ibuprofen such as (Aleve, Naprosyn, Aspirin, Celebrex, etc) and do not take more than the prescribed dose as this can lead to ulcers and bleeding in your GI tract. You may use cold compresses to help with your symptoms.   Keep the leg elevated to help with your symptoms.   Please follow up with your primary doctor or with the orthopedic doctor within the next 7-10 days for re-evaluation and further treatment of your symptoms.   Please return to the ER sooner if you have any new or worsening symptoms.

## 2019-11-04 NOTE — ED Triage Notes (Signed)
Per pt, states she felt a "pop" in her right knee-was seen on the 12 th for same symptoms-states her knee is still swollen and it is bruised-states swelling now down to ankle

## 2019-12-20 DIAGNOSIS — Z30016 Encounter for initial prescription of transdermal patch hormonal contraceptive device: Secondary | ICD-10-CM | POA: Diagnosis not present

## 2020-04-15 ENCOUNTER — Encounter (HOSPITAL_COMMUNITY): Payer: Self-pay | Admitting: Emergency Medicine

## 2020-04-15 ENCOUNTER — Other Ambulatory Visit: Payer: Self-pay

## 2020-04-15 ENCOUNTER — Emergency Department (HOSPITAL_COMMUNITY)
Admission: EM | Admit: 2020-04-15 | Discharge: 2020-04-15 | Disposition: A | Discharge status: Home / Self Care | Payer: Medicaid Other | Attending: Emergency Medicine | Admitting: Emergency Medicine

## 2020-04-15 DIAGNOSIS — S90462A Insect bite (nonvenomous), left great toe, initial encounter: Secondary | ICD-10-CM | POA: Diagnosis not present

## 2020-04-15 DIAGNOSIS — T63391A Toxic effect of venom of other spider, accidental (unintentional), initial encounter: Secondary | ICD-10-CM | POA: Insufficient documentation

## 2020-04-15 DIAGNOSIS — W57XXXA Bitten or stung by nonvenomous insect and other nonvenomous arthropods, initial encounter: Secondary | ICD-10-CM

## 2020-04-15 MED ORDER — DOXYCYCLINE HYCLATE 100 MG PO TBEC: 100.0000 mg | DELAYED_RELEASE_TABLET | Freq: Two times a day (BID) | ORAL | 0 refills | Status: DC

## 2020-04-15 NOTE — Discharge Instructions (Signed)
Soak area 20 minutes every 2 hours.

## 2020-04-15 NOTE — ED Provider Notes (Signed)
[facility]-EMERGENCY DEPT Provider Note   CSN: [id number] Arrival date & time: 04/15/20  1025     History Chief Complaint  Patient presents with  . Insect Bite    Gina Hayden is a 39 y.o. female.  The history is provided by the patient. No language interpreter was used.  Toe Pain This is a new problem. The current episode started yesterday. The problem occurs constantly. The problem has not changed since onset.Nothing aggravates the symptoms. Nothing relieves the symptoms. She has tried nothing for the symptoms. The treatment provided no relief.   Pt reports she thinks a spider bit her on her toe. Pt complains of redness and swelling. Pt was bitten 2 days ago on herleft big toe    Past Medical History:  Diagnosis Date  . Medical history non-contributory   . No pertinent past medical history     Patient Active Problem List   Diagnosis Date Noted  . History of miscarriage, currently pregnant 08/10/2018  . Supervision of other normal pregnancy, antepartum 08/02/2018  . Seasonal allergic rhinitis 11/12/2011    Past Surgical History:  Procedure Laterality Date  . DILATION AND CURETTAGE OF UTERUS     abortion  . WISDOM TOOTH EXTRACTION       OB History    Gravida  5   Para  2   Term  2   Preterm      AB  2   Living  2     SAB  1   TAB  1   Ectopic      Multiple      Live Births  2           Family History  Problem Relation Age of Onset  . Hypertension Mother   . Hyperlipidemia Mother   . Hypertension Sister   . Hyperlipidemia Sister   . Stroke Father   . Stroke Maternal Grandmother   . Diabetes Mellitus I Paternal Grandmother   . Diabetes Paternal Grandmother     Social History   Tobacco Use  . Smoking status: Never Smoker  . Smokeless tobacco: Never Used  Vaping Use  . Vaping Use: Never used  Substance Use Topics  . Alcohol use: Yes    Comment: Socially  . Drug use: No    Home Medications Prior to  Admission medications   Medication Sig Start Date End Date Taking? Authorizing Provider  acetaminophen (TYLENOL) 325 MG tablet Take 650 mg by mouth every 6 (six) hours as needed.    [provider]  Calcium Carbonate-Vit D-Min (CALCIUM 1200 PO) Take by mouth.    [provider]  Doxylamine-Pyridoxine 10-10 MG TBEC Take 1 tablet by mouth as directed. Take 2 tablets at bedtime. May add 1 tablet each morning starting on Day 3 if symptoms persist. May add additional tablet at lunchtime on Day 4 if symptoms persist. Patient not taking: Reported on 09/23/2018 08/26/18   [name], CNM  Multiple Vitamin (MULTIVITAMIN) tablet Take 1 tablet by mouth daily.    [provider]  naproxen (NAPROSYN) 500 MG tablet Take 1 tablet (500 mg total) by mouth every 12 (twelve) hours as needed for mild pain or moderate pain. 10/31/19   Antony Madura, PA-C  Prenat-FePoly-Metf-FA-DHA-DSS (VITAFOL FE+) 90-1-200 & 50 MG CPPK Take 3 tablets by mouth daily. Patient not taking: Reported on 08/02/2018 07/20/18   [name], CNM  Prenatal Vit-Fe Fum-FA-Omega (ONE-A-DAY WOMENS PRENATAL) 28-0.8 & 223 MG MISC Take 1  tablet by mouth daily. 08/02/18   [name], CNM    Allergies    Patient has no known allergies.  Review of Systems   Review of Systems  All other systems reviewed and are negative.   Physical Exam Updated Vital Signs BP (!) 164/98 (BP Location: Right Arm)   Pulse (!) 102   Temp 99.2 F (37.3 C) (Oral)   Resp 16   SpO2 100%   Physical Exam Vitals and nursing note reviewed.  Constitutional:      Appearance: She is well-developed.  HENT:     Head: Normocephalic.  Pulmonary:     Effort: Pulmonary effort is normal.  Abdominal:     General: There is no distension.  Musculoskeletal:        General: Swelling present.     Cervical back: Normal range of motion.     Comments: 1cm swollen area dorsal left 1st toe.   Neurological:     Mental Status: She is alert and  oriented to person, place, and time.  Psychiatric:        Mood and Affect: Mood normal.     ED Results / Procedures / Treatments   Labs (all labs ordered are listed, but only abnormal results are displayed) Labs Reviewed - No data to display  EKG None  Radiology No results found.  Procedures Procedures (including critical care time)  Medications Ordered in ED Medications - No data to display  ED Course  I have reviewed the triage vital signs and the nursing notes.  Pertinent labs & imaging results that were available during my care of the patient were reviewed by me and considered in my medical decision making (see chart for details).    MDM Rules/Calculators/A&P                          MDM:  Pt advised to soak area. Rx for doxycycline Final Clinical Impression(s) / ED Diagnoses Final diagnoses:  None    Rx / DC Orders ED Discharge Orders         Ordered    doxycycline (DORYX) 100 MG EC tablet  2 times daily        04/15/20 1228        An After Visit Summary was printed and given to the patient.    Elson Areas, [place] 04/15/20 1229    Tegeler, Canary Brim, MD 04/15/20 [phone]

## 2020-04-15 NOTE — ED Triage Notes (Signed)
States she noticed insect bite on left big toe 2 days ago-states it itches, no pain

## 2020-04-16 ENCOUNTER — Telehealth: Payer: Self-pay

## 2020-04-16 NOTE — Telephone Encounter (Signed)
Transition Care Management Unsuccessful Follow-up Telephone Call  Date of discharge and from where:   [name] 04/15/2020  Attempts:  1st Attempt  Reason for unsuccessful TCM follow-up call:  No answer/busy

## 2020-04-17 NOTE — Telephone Encounter (Signed)
Transition Care Management Unsuccessful Follow-up Telephone Call  Date of discharge and from where:  [name] 04/15/2020  Attempts:  2nd Attempt  Reason for unsuccessful TCM follow-up call:  Left voice message

## 2020-06-01 IMAGING — US US OB COMP LESS 14 WK
1 series · 15 of 22 positions shown · non-contrast
Comparison: None for this gestation

CLINICAL DATA: Abdominal pain during first trimester pregnancy,
8 weeks 1 day by LMP of 02/03/2018

EXAM:
OBSTETRIC <14 WK ULTRASOUND
TECHNIQUE: Transabdominal ultrasound was performed for evaluation of the
gestation as well as the maternal uterus and adnexal regions.

[Series 1: us ob comp less 14 wk · 15 of 22 slices shown]
[im 1/22]
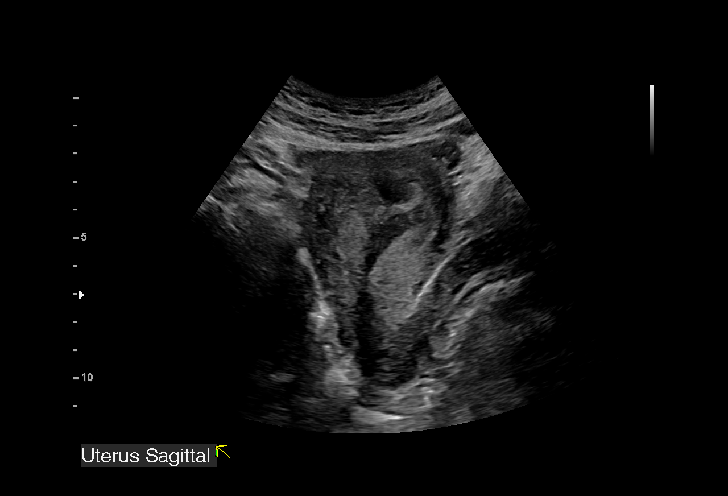
[im 3/22]
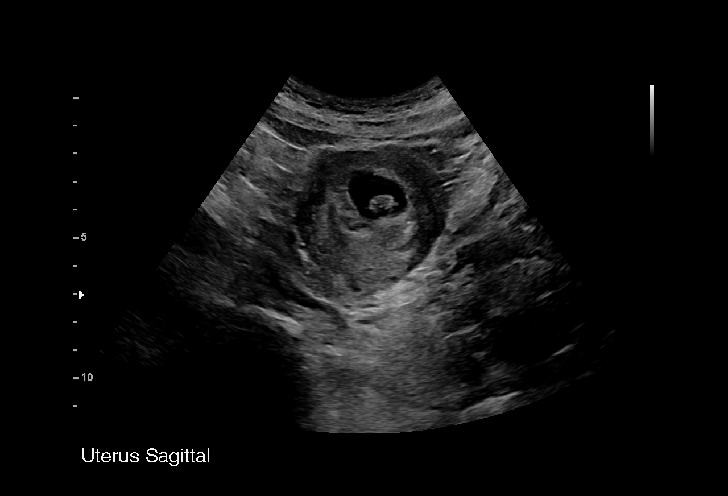
[im 4/22]
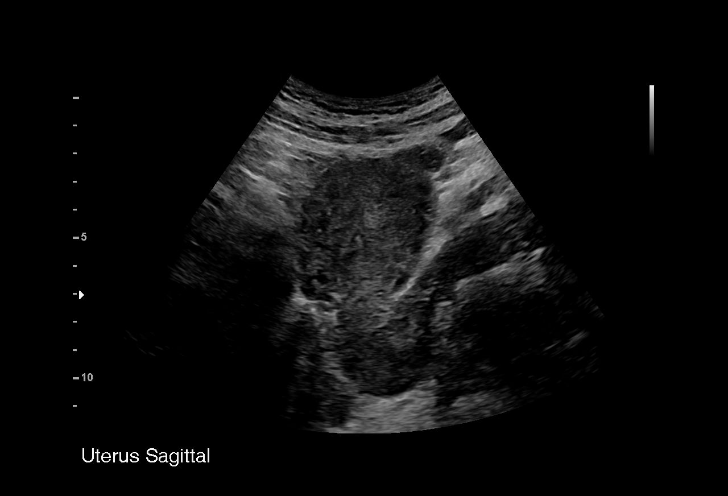
[im 6/22]
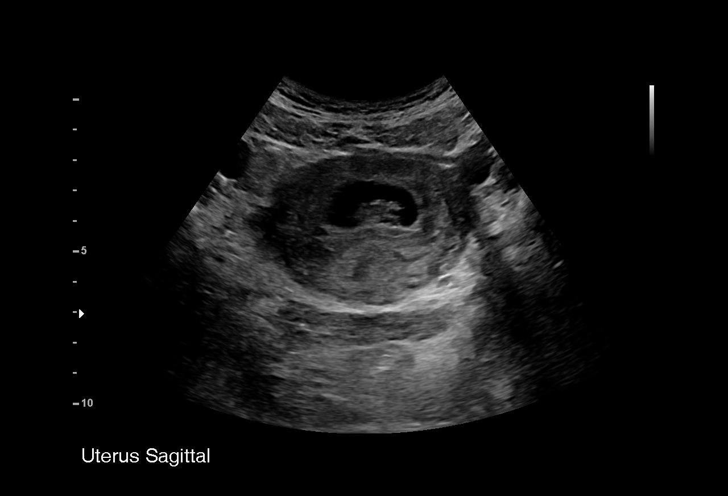
[im 7/22]
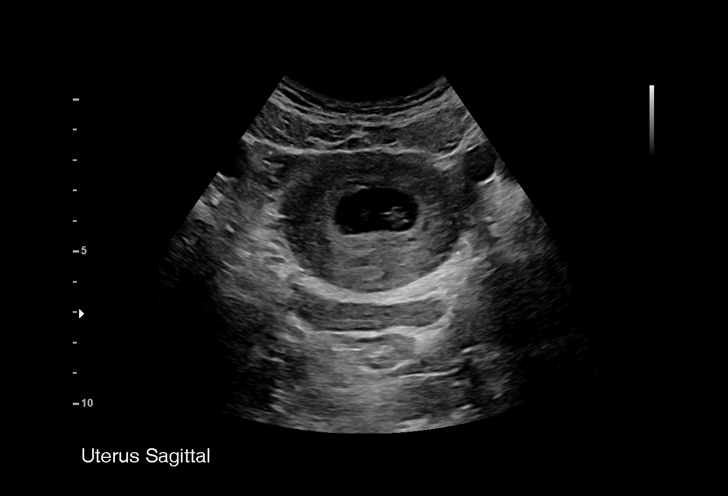
[im 9/22]
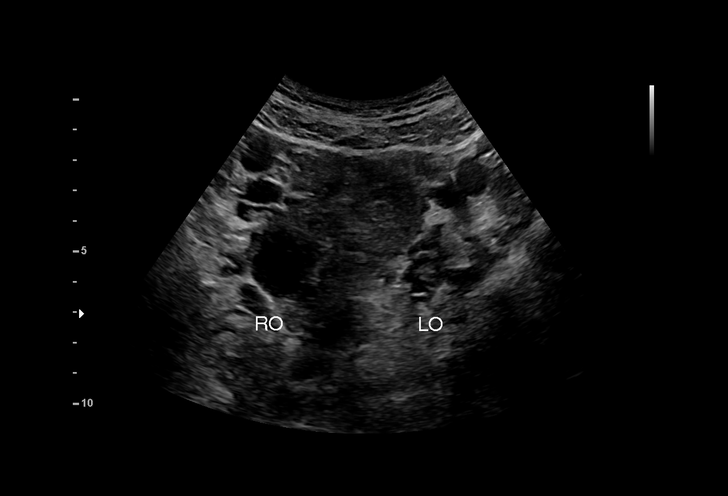
[im 10/22]
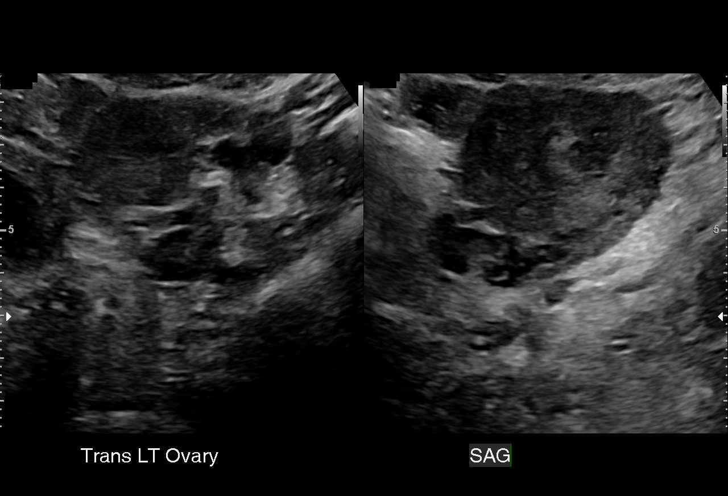
[im 12/22]
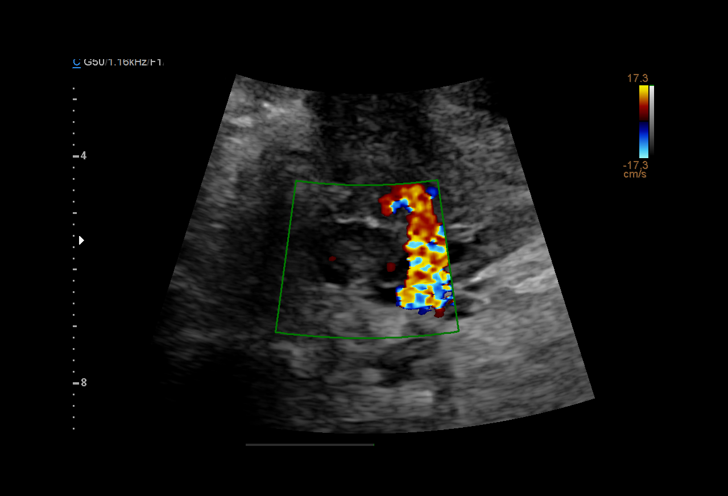
[im 13/22]
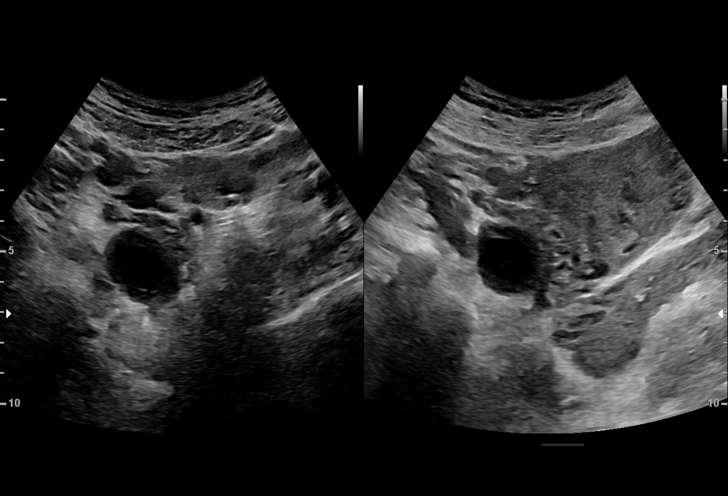
[im 14/22]
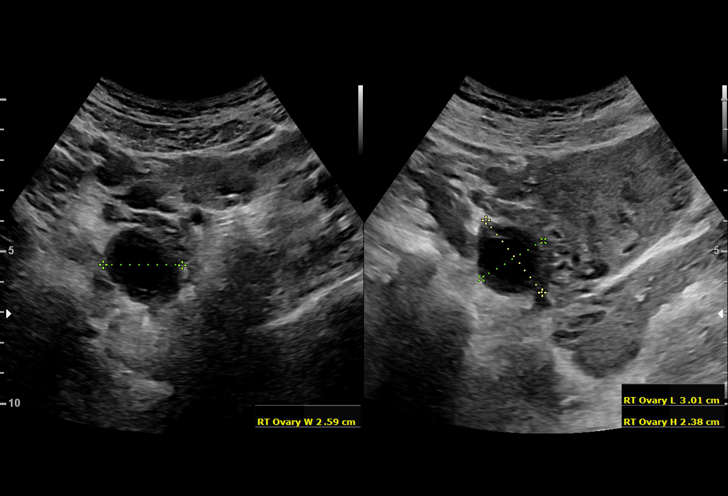
[im 16/22]
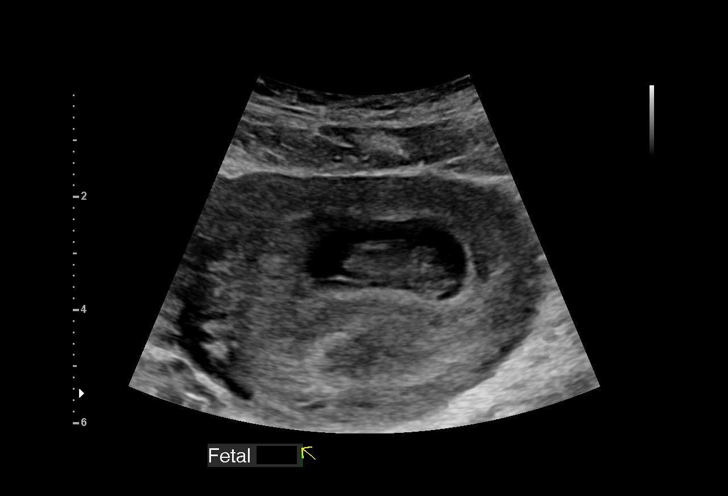
[im 17/22]
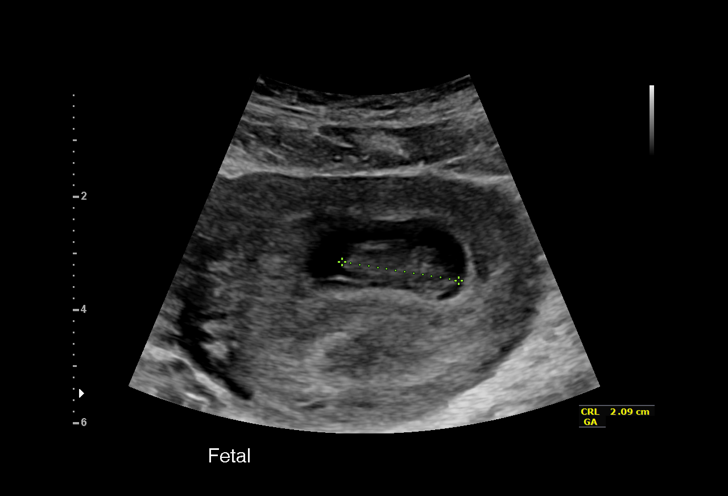
[im 19/22]
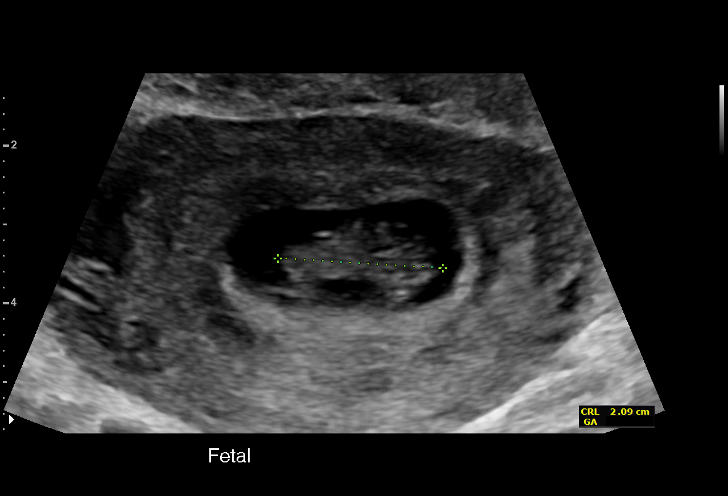
[im 20/22]
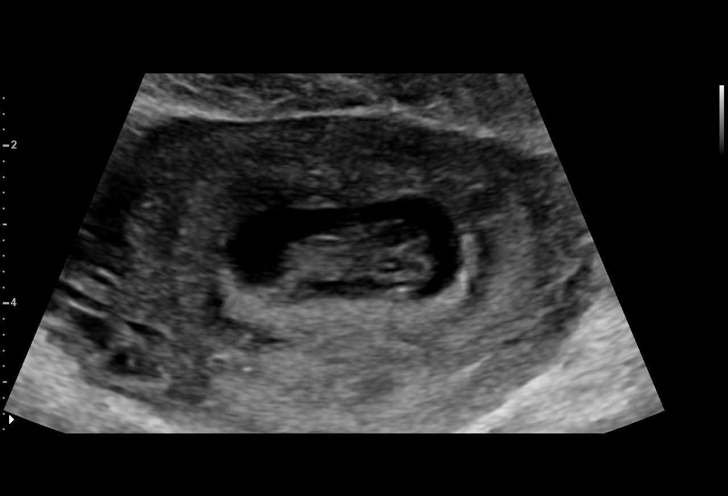
[im 22/22]
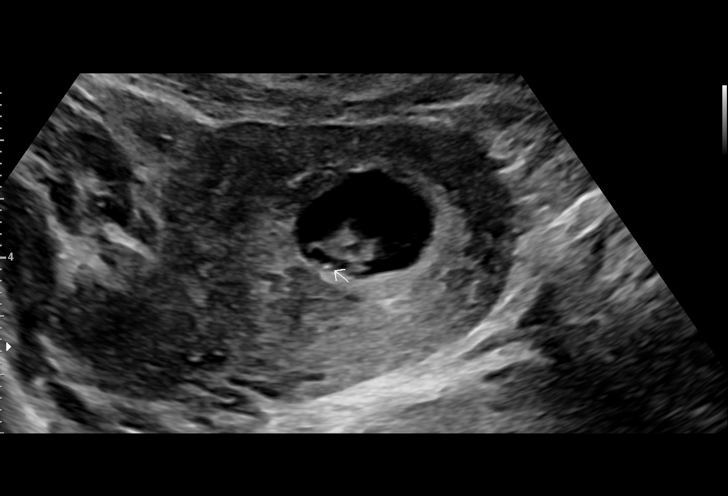

[15 of 22 positions shown; findings below may reference images not displayed]

FINDINGS: Intrauterine gestational sac: Present, single

Yolk sac:  Present

Embryo:  Present

Cardiac Activity: Present

Heart Rate: 173 bpm

CRL:   20.6 mm   8 w 4 d                  US EDC: 11/07/2018

Subchorionic hemorrhage:  None visualized.

Maternal uterus/adnexae:

LEFT ovary normal size and morphology 1.7 x 2.6 x 1.3 cm.

RIGHT ovary measures 2.6 x 3.0 x 2.4 cm and contains a corpus luteal
cyst.

No free pelvic fluid or adnexal masses.
IMPRESSION: Single live intrauterine gestation at 8 weeks 4 days EGA by
crown-rump length.

No acute abnormalities identified by transabdominal imaging.

## 2020-07-31 DIAGNOSIS — Z114 Encounter for screening for human immunodeficiency virus [HIV]: Secondary | ICD-10-CM | POA: Diagnosis not present

## 2020-07-31 DIAGNOSIS — Z113 Encounter for screening for infections with a predominantly sexual mode of transmission: Secondary | ICD-10-CM | POA: Diagnosis not present

## 2020-07-31 DIAGNOSIS — Z30011 Encounter for initial prescription of contraceptive pills: Secondary | ICD-10-CM | POA: Diagnosis not present

## 2020-09-27 IMAGING — US US OB < 14 WEEKS - US OB TV
1 series · 15 of 28 positions shown · non-contrast
Comparison: None for this gestation

CLINICAL DATA: Pregnant, quantitative beta HCG 19,641 on
07/20/2018, LMP 06/13/2018; had spontaneous abortion on 04/17/2018
at

EXAM:
OBSTETRIC <14 WK US AND TRANSVAGINAL OB US
TECHNIQUE: Both transabdominal and transvaginal ultrasound examinations were
performed for complete evaluation of the gestation as well as the
maternal uterus, adnexal regions, and pelvic cul-de-sac.
Transvaginal technique was performed to assess early pregnancy.

[Series 1: us ob < 14 weeks - us ob tv · 15 of 61 slices shown]
[im 1/61]
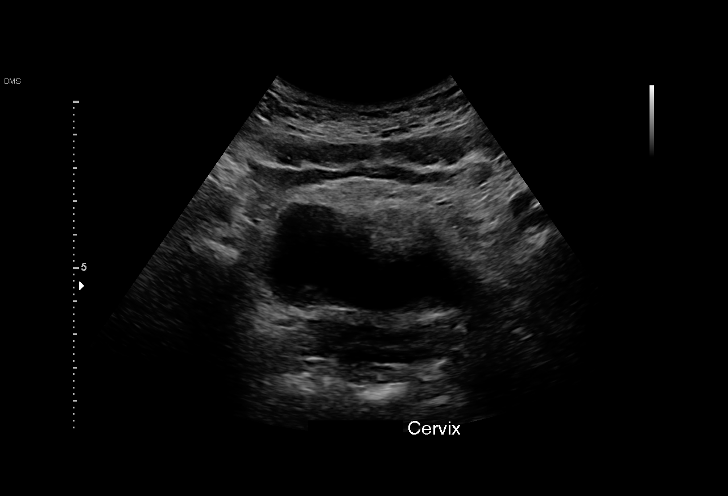
[im 5/61]
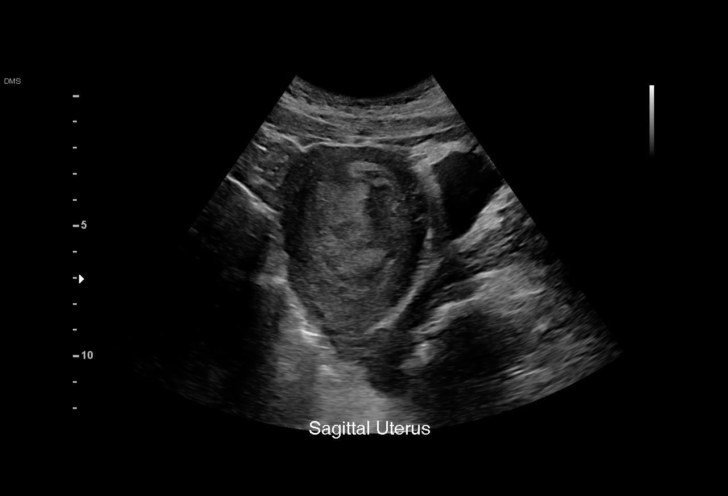
[im 9/61]
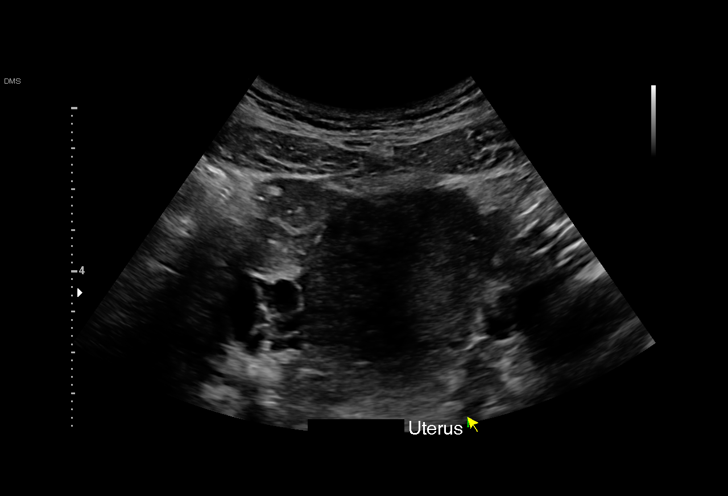
[im 14/61]
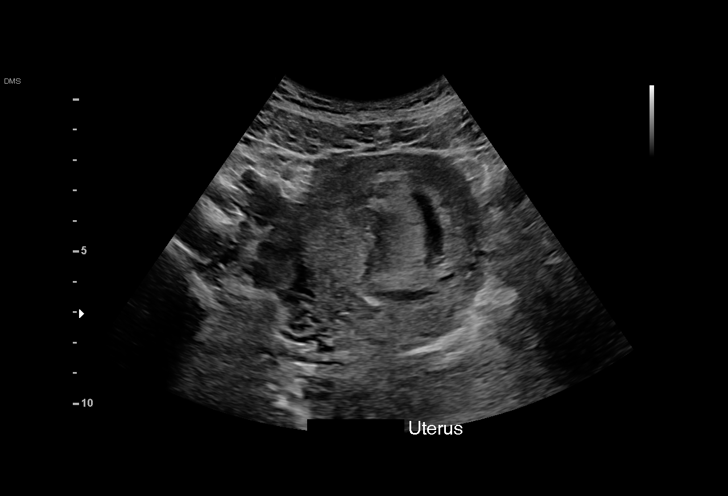
[im 18/61]
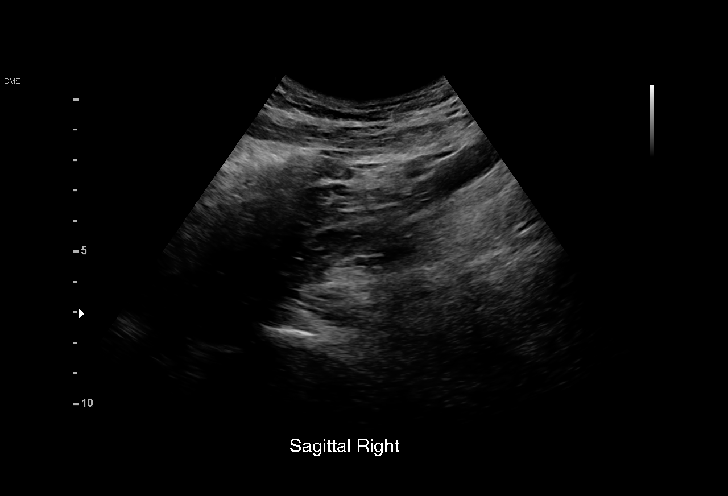
[im 23/61]
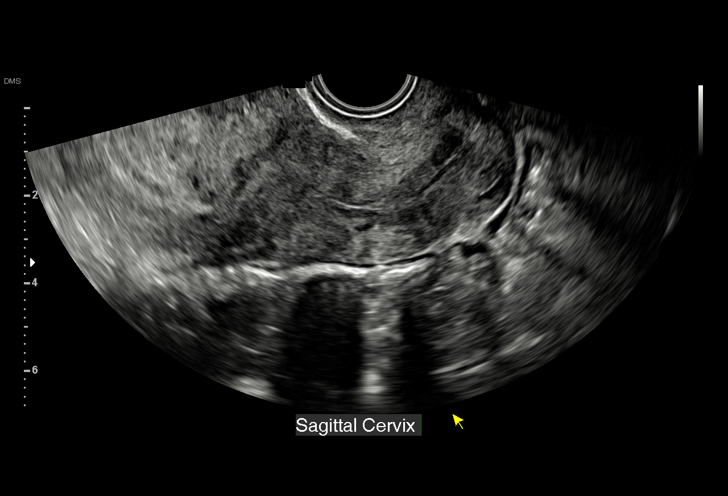
[im 27/61]
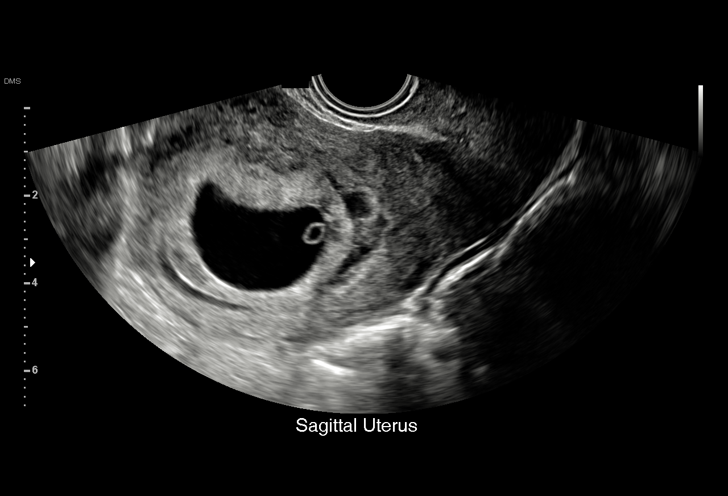
[im 32/61]
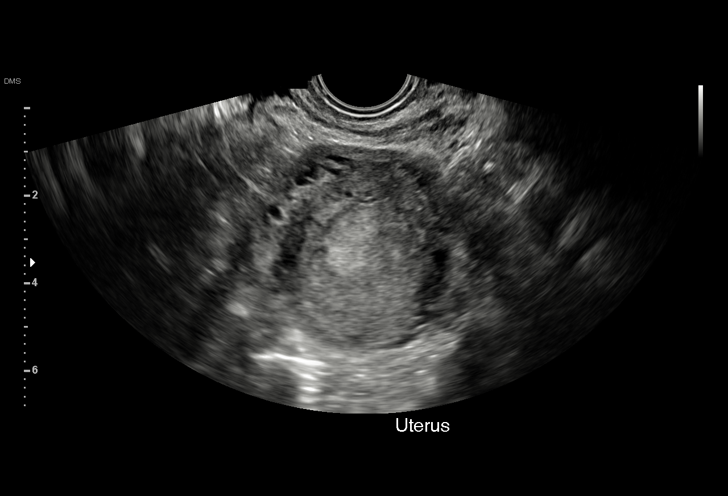
[im 34/61]
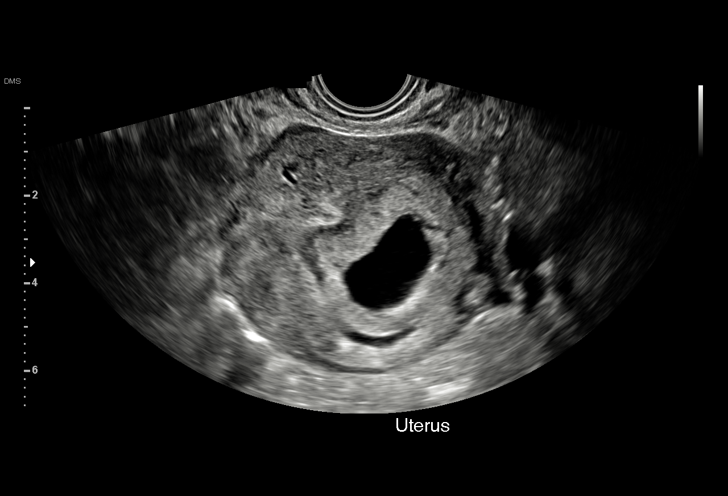
[im 38/61]
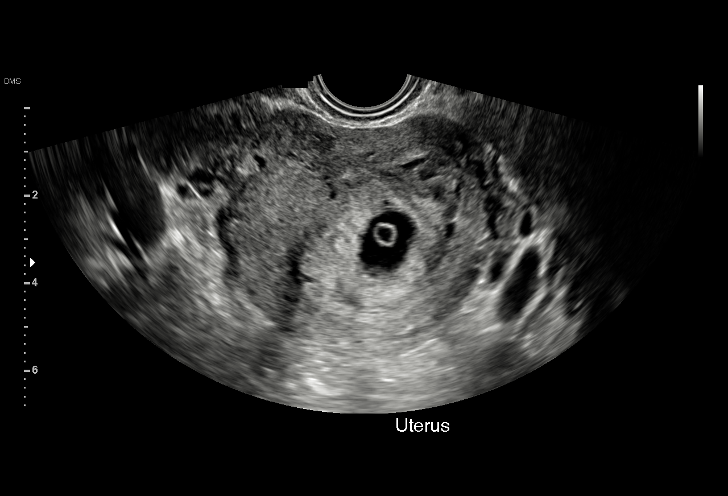
[im 43/61]
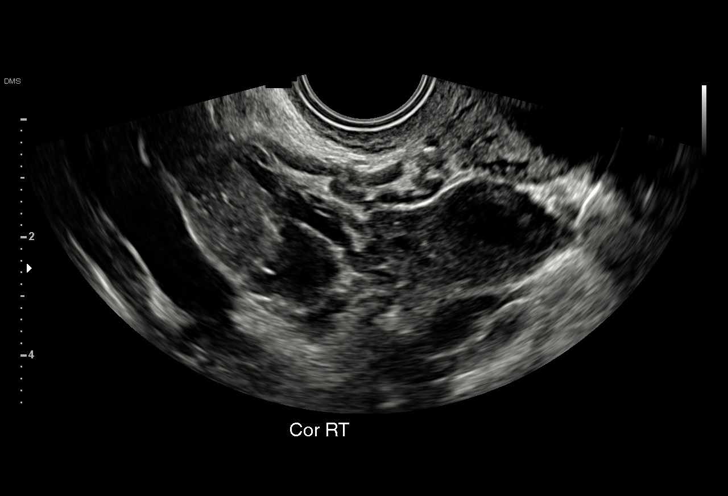
[im 47/61]
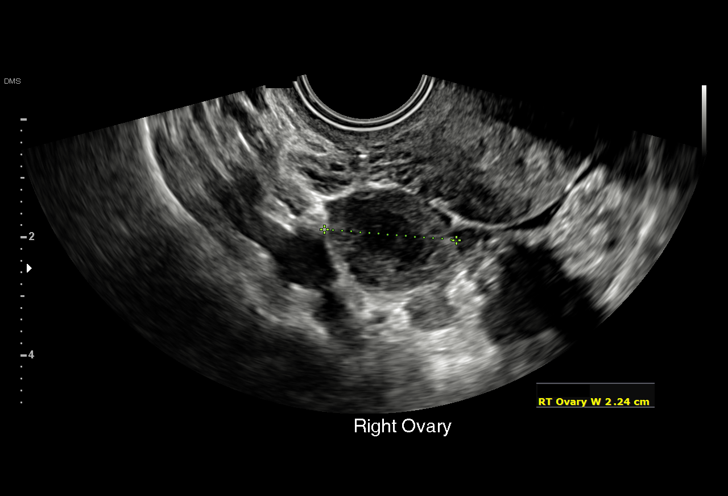
[im 52/61]
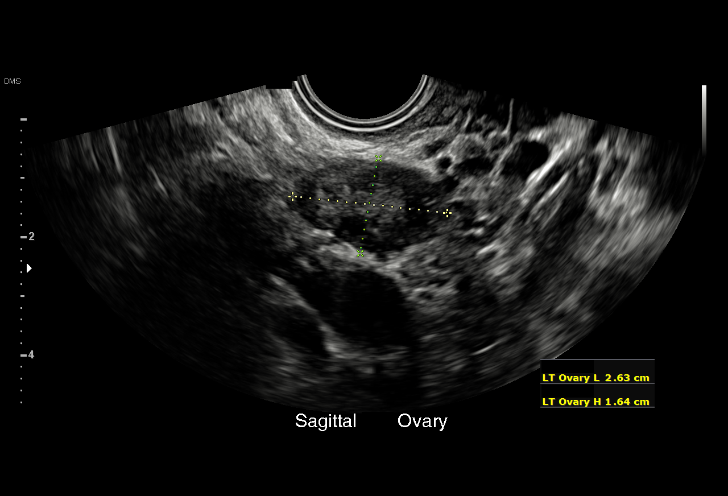
[im 56/61]
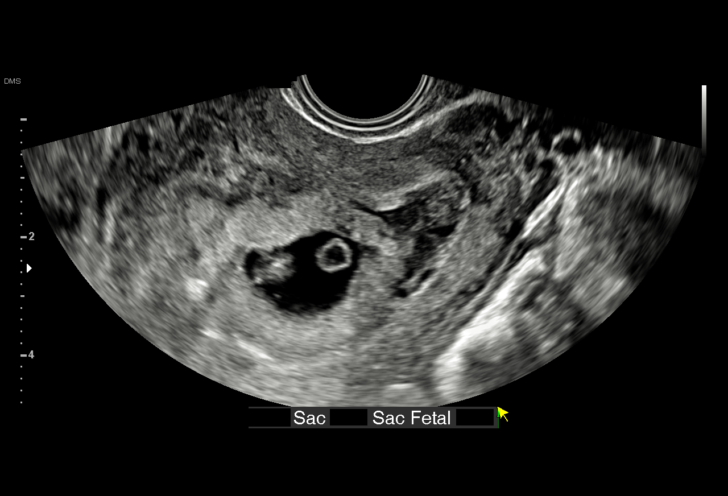
[im 61/61]
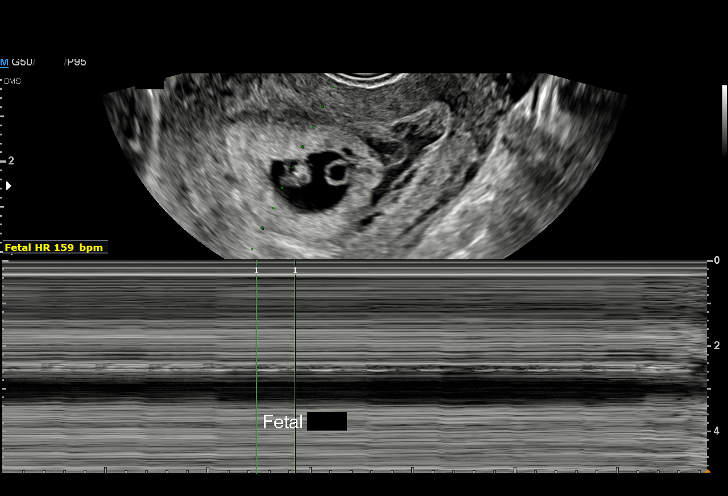

[15 of 28 positions shown; findings below may reference images not displayed]

FINDINGS: Intrauterine gestational sac: Present, single

Yolk sac:  Present

Embryo:  Present

Cardiac Activity: Present

Heart Rate: 159 bpm

CRL:  8.5 mm   6 w   5 d                  US EDC: 03/18/2019

Subchorionic hemorrhage:  Small subchronic hemorrhage present

Maternal uterus/adnexae:

Uterus otherwise normal appearance.

Corpus luteum within RIGHT ovary.

Ovaries otherwise unremarkable.

No free pelvic fluid or adnexal masses.
IMPRESSION: Single live intrauterine gestation at 6 weeks 5 days EGA by
crown-rump length.

Small subchronic hemorrhage.

## 2020-11-28 ENCOUNTER — Encounter (HOSPITAL_COMMUNITY): Payer: Self-pay | Admitting: *Deleted

## 2020-11-28 ENCOUNTER — Other Ambulatory Visit: Payer: Self-pay

## 2020-11-28 ENCOUNTER — Emergency Department (HOSPITAL_COMMUNITY)
Admission: EM | Admit: 2020-11-28 | Discharge: 2020-11-28 | Disposition: A | Discharge status: Home / Self Care | Payer: Medicaid Other | Attending: Emergency Medicine | Admitting: Emergency Medicine

## 2020-11-28 DIAGNOSIS — R109 Unspecified abdominal pain: Secondary | ICD-10-CM | POA: Insufficient documentation

## 2020-11-28 DIAGNOSIS — Z20822 Contact with and (suspected) exposure to covid-19: Secondary | ICD-10-CM | POA: Diagnosis not present

## 2020-11-28 DIAGNOSIS — R1031 Right lower quadrant pain: Secondary | ICD-10-CM | POA: Diagnosis not present

## 2020-11-28 DIAGNOSIS — R110 Nausea: Secondary | ICD-10-CM | POA: Diagnosis not present

## 2020-11-28 DIAGNOSIS — R519 Headache, unspecified: Secondary | ICD-10-CM | POA: Diagnosis not present

## 2020-11-28 LAB — CBC WITH DIFFERENTIAL/PLATELET
Abs Immature Granulocytes: 0.02 10*3/uL (ref 0.00–0.07)
Basophils Absolute: 0 10*3/uL (ref 0.0–0.1)
Basophils Relative: 1 %
Eosinophils Absolute: 0.1 10*3/uL (ref 0.0–0.5)
Eosinophils Relative: 2 %
HCT: 40 % (ref 36.0–46.0)
Hemoglobin: 12.9 g/dL (ref 12.0–15.0)
Immature Granulocytes: 0 %
Lymphocytes Relative: 21 %
Lymphs Abs: 1.5 10*3/uL (ref 0.7–4.0)
MCH: 28.7 pg (ref 26.0–34.0)
MCHC: 32.3 g/dL (ref 30.0–36.0)
MCV: 88.9 fL (ref 80.0–100.0)
Monocytes Absolute: 0.4 10*3/uL (ref 0.1–1.0)
Monocytes Relative: 5 %
Neutro Abs: 5.2 10*3/uL (ref 1.7–7.7)
Neutrophils Relative %: 71 %
Platelets: 293 10*3/uL (ref 150–400)
RBC: 4.5 MIL/uL (ref 3.87–5.11)
RDW: 11.8 % (ref 11.5–15.5)
WBC: 7.2 10*3/uL (ref 4.0–10.5)
nRBC: 0 % (ref 0.0–0.2)

## 2020-11-28 LAB — URINALYSIS, ROUTINE W REFLEX MICROSCOPIC
Bacteria, UA: NONE SEEN
Bilirubin Urine: NEGATIVE
Glucose, UA: NEGATIVE mg/dL
Ketones, ur: 20 mg/dL — AB
Leukocytes,Ua: NEGATIVE
Nitrite: NEGATIVE
Protein, ur: NEGATIVE mg/dL
Specific Gravity, Urine: 1.01 (ref 1.005–1.030)
pH: 6 (ref 5.0–8.0)

## 2020-11-28 LAB — COMPREHENSIVE METABOLIC PANEL
ALT: 15 U/L (ref 0–44)
AST: 14 U/L — ABNORMAL LOW (ref 15–41)
Albumin: 3.8 g/dL (ref 3.5–5.0)
Alkaline Phosphatase: 27 U/L — ABNORMAL LOW (ref 38–126)
Anion gap: 6 (ref 5–15)
BUN: 9 mg/dL (ref 6–20)
CO2: 25 mmol/L (ref 22–32)
Calcium: 8.7 mg/dL — ABNORMAL LOW (ref 8.9–10.3)
Chloride: 107 mmol/L (ref 98–111)
Creatinine, Ser: 0.39 mg/dL — ABNORMAL LOW (ref 0.44–1.00)
GFR, Estimated: >60 mL/min (ref >60)
Glucose, Bld: 81 mg/dL (ref 70–99)
Potassium: 3.6 mmol/L (ref 3.5–5.1)
Sodium: 138 mmol/L (ref 135–145)
Total Bilirubin: <0.1 mg/dL — ABNORMAL LOW (ref 0.3–1.2)
Total Protein: 7.3 g/dL (ref 6.5–8.1)

## 2020-11-28 LAB — RESP PANEL BY RT-PCR (FLU A&B, COVID) ARPGX2
Influenza A by PCR: NEGATIVE
Influenza B by PCR: NEGATIVE
SARS Coronavirus 2 by RT PCR: NEGATIVE

## 2020-11-28 LAB — LIPASE, BLOOD: Lipase: 31 U/L (ref 11–51)

## 2020-11-28 LAB — I-STAT BETA HCG BLOOD, ED (MC, WL, AP ONLY): I-stat hCG, quantitative: <5 m[IU]/mL (ref <5)

## 2020-11-28 MED ORDER — KETOROLAC TROMETHAMINE 30 MG/ML IJ SOLN
30.0000 mg | Freq: Once | INTRAMUSCULAR | Status: AC
  Administered 2020-11-28: 30 mg via INTRAVENOUS
  Filled 2020-11-28: qty 1

## 2020-11-28 MED ORDER — NAPROXEN 375 MG PO TABS: 375.0000 mg | ORAL_TABLET | Freq: Two times a day (BID) | ORAL | 0 refills | Status: DC

## 2020-11-28 MED ORDER — ACETAMINOPHEN 325 MG PO TABS
650.0000 mg | ORAL_TABLET | Freq: Once | ORAL | Status: AC
  Administered 2020-11-28: 650 mg via ORAL
  Filled 2020-11-28: qty 2

## 2020-11-28 MED ORDER — SODIUM CHLORIDE 0.9 % IV BOLUS
1000.0000 mL | Freq: Once | INTRAVENOUS | Status: AC
  Administered 2020-11-28: 1000 mL via INTRAVENOUS

## 2020-11-28 MED ORDER — DIPHENHYDRAMINE HCL 50 MG/ML IJ SOLN
12.5000 mg | Freq: Once | INTRAMUSCULAR | Status: AC
  Administered 2020-11-28: 12.5 mg via INTRAVENOUS
  Filled 2020-11-28: qty 1

## 2020-11-28 MED ORDER — PROCHLORPERAZINE EDISYLATE 10 MG/2ML IJ SOLN
10.0000 mg | Freq: Once | INTRAMUSCULAR | Status: AC
  Administered 2020-11-28: 10 mg via INTRAVENOUS
  Filled 2020-11-28: qty 2

## 2020-11-28 NOTE — ED Triage Notes (Signed)
Pt complains of headache x 4 days, RLQ abdominal pain x 2 days and nausea. No vomiting or diarrhea. She has tried OTC meds w/o relief.

## 2020-11-28 NOTE — ED Notes (Signed)
Fluid challenge pt. Gave 2 cups water. Grayling Congress, NT

## 2020-11-28 NOTE — Discharge Instructions (Signed)

## 2020-11-28 NOTE — ED Provider Notes (Addendum)
[facility]-EMERGENCY DEPT Provider Note   CSN: [id number] Arrival date & time: 11/28/20  1121     History Chief Complaint  Patient presents with  . Headache  . Abdominal Pain    Gina Hayden is a 40 y.o. female.  HPI   Pt is a 40 y/o female who presents to the ED today for eval of headache and abd pain.   Headache: Pt reports posterior and left sided headache that started 4 days ago. She has tried tylenol and motrin without relief. States pain is constant and is described as a throbbing pain. Rates pain 10/10.  Reports chronic congestion from allergies.  She denies body aches, sore throat, vision changes, photophobia, unilateral numbness/weakness, or dizziness.  Abd pain: Pt reports abd pain x2 days. Reports pain is sharp and located to the right side of the abdomen. The pain radiates to the mid abdomen. Rates pain 6/10. The pain is intermittent in nature and lasts for about 5 minutes ata time. She has no pain currently. Reports associated intermittent nausea, but denies vomiting. Denies associated fevers, diarrhea, constipation, dysuria, frequency, urgency, hematuria, vaginal discharge, vaginal bleeding. States she has been sexually active with 1 partner in the last 12 months. She does not always use protection. She denies concern for STDs.   Past Medical History:  Diagnosis Date  . Medical history non-contributory   . No pertinent past medical history     Patient Active Problem List   Diagnosis Date Noted  . History of miscarriage, currently pregnant 08/10/2018  . Supervision of other normal pregnancy, antepartum 08/02/2018  . Seasonal allergic rhinitis 11/12/2011    Past Surgical History:  Procedure Laterality Date  . DILATION AND CURETTAGE OF UTERUS     abortion  . WISDOM TOOTH EXTRACTION       OB History    Gravida  5   Para  2   Term  2   Preterm      AB  2   Living  2     SAB  1   IAB  1   Ectopic      Multiple       Live Births  2           Family History  Problem Relation Age of Onset  . Hypertension Mother   . Hyperlipidemia Mother   . Hypertension Sister   . Hyperlipidemia Sister   . Stroke Father   . Stroke Maternal Grandmother   . Diabetes Mellitus I Paternal Grandmother   . Diabetes Paternal Grandmother     Social History   Tobacco Use  . Smoking status: Never Smoker  . Smokeless tobacco: Never Used  Vaping Use  . Vaping Use: Never used  Substance Use Topics  . Alcohol use: Yes    Comment: Socially  . Drug use: No    Home Medications Prior to Admission medications   Medication Sig Start Date End Date Taking? Authorizing Provider  naproxen (NAPROSYN) 375 MG tablet Take 1 tablet (375 mg total) by mouth 2 (two) times daily. 11/28/20  Yes Onica Davidovich S, PA-C  acetaminophen (TYLENOL) 325 MG tablet Take 650 mg by mouth every 6 (six) hours as needed.    [provider]  Calcium Carbonate-Vit D-Min (CALCIUM 1200 PO) Take by mouth.    [provider]  doxycycline (DORYX) 100 MG EC tablet Take 1 tablet (100 mg total) by mouth 2 (two) times daily. 04/15/20   Elson Areas, PA-C  Doxylamine-Pyridoxine 10-10 MG TBEC Take 1 tablet by mouth as directed. Take 2 tablets at bedtime. May add 1 tablet each morning starting on Day 3 if symptoms persist. May add additional tablet at lunchtime on Day 4 if symptoms persist. Patient not taking: Reported on 09/23/2018 08/26/18   [name], CNM  Multiple Vitamin (MULTIVITAMIN) tablet Take 1 tablet by mouth daily.    [provider]  Prenat-FePoly-Metf-FA-DHA-DSS (VITAFOL FE+) 90-1-200 & 50 MG CPPK Take 3 tablets by mouth daily. Patient not taking: Reported on 08/02/2018 07/20/18   [name], CNM  Prenatal Vit-Fe Fum-FA-Omega (ONE-A-DAY WOMENS PRENATAL) 28-0.8 & 223 MG MISC Take 1 tablet by mouth daily. 08/02/18   [name], CNM    Allergies    Patient has no known allergies.  Review of Systems   Review of  Systems  Constitutional: Negative for fever.  HENT: Negative for ear pain and sore throat.   Eyes: Negative for visual disturbance.  Respiratory: Negative for cough and shortness of breath.   Cardiovascular: Negative for chest pain.  Gastrointestinal: Positive for abdominal pain and nausea. Negative for constipation, diarrhea and vomiting.  Genitourinary: Negative for dysuria, flank pain, frequency, hematuria, urgency, vaginal bleeding and vaginal discharge.  Musculoskeletal: Negative for back pain.  Skin: Negative for rash.  Neurological: Positive for headaches.  All other systems reviewed and are negative.   Physical Exam Updated Vital Signs BP (!) 138/96 (BP Location: Left Arm)   Pulse 88   Temp 98.7 F (37.1 C) (Oral)   Resp 18   LMP 11/21/2020   SpO2 96%   Physical Exam Vitals and nursing note reviewed.  Constitutional:      General: She is not in acute distress.    Appearance: She is well-developed.  HENT:     Head: Normocephalic and atraumatic.  Eyes:     Conjunctiva/sclera: Conjunctivae normal.  Cardiovascular:     Rate and Rhythm: Normal rate and regular rhythm.     Heart sounds: No murmur heard.   Pulmonary:     Effort: Pulmonary effort is normal. No respiratory distress.     Breath sounds: Normal breath sounds.  Abdominal:     Palpations: Abdomen is soft.     Tenderness: There is no abdominal tenderness.  Musculoskeletal:     Cervical back: Neck supple.  Skin:    General: Skin is warm and dry.  Neurological:     Mental Status: She is alert.     ED Results / Procedures / Treatments   Labs (all labs ordered are listed, but only abnormal results are displayed) Labs Reviewed  COMPREHENSIVE METABOLIC PANEL - Abnormal; Notable for the following components:      Result Value   Creatinine, Ser 0.39 (*)    Calcium 8.7 (*)    AST 14 (*)    Alkaline Phosphatase 27 (*)    Total Bilirubin <0.1 (*)    All other components within normal limits  URINALYSIS,  ROUTINE W REFLEX MICROSCOPIC - Abnormal; Notable for the following components:   Color, Urine STRAW (*)    Hgb urine dipstick SMALL (*)    Ketones, ur 20 (*)    All other components within normal limits  RESP PANEL BY RT-PCR (FLU A&B, COVID) ARPGX2  CBC WITH DIFFERENTIAL/PLATELET  LIPASE, BLOOD  I-STAT BETA HCG BLOOD, ED ([facility], [facility], [facility] ONLY)    EKG None  Radiology No results found.  Procedures Procedures   Medications Ordered in ED Medications  ketorolac (TORADOL) 30 MG/ML injection  30 mg (30 mg Intravenous Given 11/28/20 1339)  prochlorperazine (COMPAZINE) injection 10 mg (10 mg Intravenous Given 11/28/20 1340)  diphenhydrAMINE (BENADRYL) injection 12.5 mg (12.5 mg Intravenous Given 11/28/20 1340)  sodium chloride 0.9 % bolus 1,000 mL (1,000 mLs Intravenous New Bag/Given 11/28/20 1340)  acetaminophen (TYLENOL) tablet 650 mg (650 mg Oral Given 11/28/20 1337)    ED Course  I have reviewed the triage vital signs and the nursing notes.  Pertinent labs & imaging results that were available during my care of the patient were reviewed by me and considered in my medical decision making (see chart for details).    MDM Rules/Calculators/A&P                          40 year old female presents the emergency department today for evaluation of headache and abdominal pain.  Reviewed/interpreted labs CBC is without leukocytosis or anemia CMP shows normal creatinine, normal LFTs, and normal electrolytes Lipase is negative Beta-hCG is negative UA shows ketonuria and hematuria.  There are no leukocytes, nitrites or bacteria to suggest infection.  Patient's neurologic exam was normal, her initial and repeat abdominal exams are benign.  She was given a migraine cocktail and on reassessment she states that her headache has improved and she does not have any abdominal pain. she has been able to tolerate po. Offered further work-up of her abdominal pain however she declines at this time, I have low  suspicion for appendicitis, pancreatitis, cholecystitis, PID, ectopic pregnancy or other emergent intra-abdominal/pelvic cause of symptoms at this time.  Additionally, her headache does not seem concerning for CVA, ICH, temporal arteritis or other emergent cause.  Feel she is appropriate for discharge home with close follow-up and strict return precautions.  She voices understanding of the plan and reasons to return.  All questions answered.  Patient stable for discharge.  Final Clinical Impression(s) / ED Diagnoses Final diagnoses:  Abdominal pain, unspecified abdominal location  Bad headache    Rx / DC Orders ED Discharge Orders         Ordered    naproxen (NAPROSYN) 375 MG tablet  2 times daily        11/28/20 1531           Concepcion Kirkpatrick S, PA-C 11/28/20 1531    Lelend Heinecke S, PA-C 11/28/20 1532    Arby Barrette, MD 12/07/20 [phone]

## 2020-12-12 DIAGNOSIS — Z3045 Encounter for surveillance of transdermal patch hormonal contraceptive device: Secondary | ICD-10-CM | POA: Diagnosis not present

## 2021-06-18 ENCOUNTER — Ambulatory Visit: Payer: Self-pay

## 2021-10-14 DIAGNOSIS — Z113 Encounter for screening for infections with a predominantly sexual mode of transmission: Secondary | ICD-10-CM | POA: Diagnosis not present

## 2022-06-13 DIAGNOSIS — Z113 Encounter for screening for infections with a predominantly sexual mode of transmission: Secondary | ICD-10-CM | POA: Diagnosis not present

## 2022-06-13 DIAGNOSIS — Z114 Encounter for screening for human immunodeficiency virus [HIV]: Secondary | ICD-10-CM | POA: Diagnosis not present

## 2022-06-13 DIAGNOSIS — B3731 Acute candidiasis of vulva and vagina: Secondary | ICD-10-CM | POA: Diagnosis not present

## 2022-09-18 DIAGNOSIS — N644 Mastodynia: Secondary | ICD-10-CM | POA: Diagnosis not present

## 2022-09-22 ENCOUNTER — Other Ambulatory Visit: Payer: Self-pay | Admitting: Nurse Practitioner

## 2022-09-22 ENCOUNTER — Encounter: Payer: Self-pay | Admitting: Nurse Practitioner

## 2022-09-22 DIAGNOSIS — N644 Mastodynia: Secondary | ICD-10-CM

## 2022-11-04 ENCOUNTER — Ambulatory Visit
Admission: RE | Admit: 2022-11-04 | Discharge: 2022-11-04 | Disposition: A | Discharge status: Home / Self Care | Payer: Medicaid Other | Source: Ambulatory Visit | Attending: Nurse Practitioner | Admitting: Nurse Practitioner

## 2022-11-04 ENCOUNTER — Ambulatory Visit
Admission: RE | Admit: 2022-11-04 | Discharge: 2022-11-04 | Disposition: A | Discharge status: Home / Self Care | Payer: BC Managed Care – PPO | Source: Ambulatory Visit | Attending: Nurse Practitioner | Admitting: Nurse Practitioner

## 2022-11-04 DIAGNOSIS — N644 Mastodynia: Secondary | ICD-10-CM

## 2022-12-08 ENCOUNTER — Encounter: Payer: BC Managed Care – PPO | Admitting: Obstetrics and Gynecology

## 2022-12-17 ENCOUNTER — Inpatient Hospital Stay (HOSPITAL_COMMUNITY)
Admission: AD | Admit: 2022-12-17 | Discharge: 2022-12-17 | Disposition: A | Discharge status: Home / Self Care | Payer: BC Managed Care – PPO | Attending: Obstetrics and Gynecology | Admitting: Obstetrics and Gynecology

## 2022-12-17 ENCOUNTER — Encounter (HOSPITAL_COMMUNITY): Payer: Self-pay | Admitting: *Deleted

## 2022-12-17 ENCOUNTER — Inpatient Hospital Stay (HOSPITAL_COMMUNITY): Payer: BC Managed Care – PPO

## 2022-12-17 DIAGNOSIS — Z3A10 10 weeks gestation of pregnancy: Secondary | ICD-10-CM

## 2022-12-17 DIAGNOSIS — Z3A01 Less than 8 weeks gestation of pregnancy: Secondary | ICD-10-CM | POA: Diagnosis not present

## 2022-12-17 DIAGNOSIS — O26851 Spotting complicating pregnancy, first trimester: Secondary | ICD-10-CM | POA: Diagnosis present

## 2022-12-17 DIAGNOSIS — O034 Incomplete spontaneous abortion without complication: Secondary | ICD-10-CM | POA: Diagnosis not present

## 2022-12-17 DIAGNOSIS — N939 Abnormal uterine and vaginal bleeding, unspecified: Secondary | ICD-10-CM

## 2022-12-17 DIAGNOSIS — O26899 Other specified pregnancy related conditions, unspecified trimester: Secondary | ICD-10-CM

## 2022-12-17 DIAGNOSIS — O209 Hemorrhage in early pregnancy, unspecified: Secondary | ICD-10-CM | POA: Diagnosis not present

## 2022-12-17 DIAGNOSIS — R109 Unspecified abdominal pain: Secondary | ICD-10-CM

## 2022-12-17 HISTORY — DX: Chlamydial infection, unspecified: A74.9

## 2022-12-17 LAB — URINALYSIS, ROUTINE W REFLEX MICROSCOPIC
Bacteria, UA: NONE SEEN
Bilirubin Urine: NEGATIVE
Glucose, UA: NEGATIVE mg/dL
Ketones, ur: NEGATIVE mg/dL
Leukocytes,Ua: NEGATIVE
Nitrite: NEGATIVE
Protein, ur: NEGATIVE mg/dL
Specific Gravity, Urine: 1.024 (ref 1.005–1.030)
pH: 6 (ref 5.0–8.0)

## 2022-12-17 LAB — WET PREP, GENITAL
Clue Cells Wet Prep HPF POC: NONE SEEN
Sperm: NONE SEEN
Trich, Wet Prep: NONE SEEN
WBC, Wet Prep HPF POC: <10 (ref <10)
Yeast Wet Prep HPF POC: NONE SEEN

## 2022-12-17 LAB — ABO/RH: ABO/RH(D): A POS

## 2022-12-17 LAB — CBC
HCT: 38.6 % (ref 36.0–46.0)
Hemoglobin: 12.8 g/dL (ref 12.0–15.0)
MCH: 29.8 pg (ref 26.0–34.0)
MCHC: 33.2 g/dL (ref 30.0–36.0)
MCV: 89.8 fL (ref 80.0–100.0)
Platelets: 289 10*3/uL (ref 150–400)
RBC: 4.3 MIL/uL (ref 3.87–5.11)
RDW: 12.2 % (ref 11.5–15.5)
WBC: 6 10*3/uL (ref 4.0–10.5)
nRBC: 0 % (ref 0.0–0.2)

## 2022-12-17 LAB — HCG, QUANTITATIVE, PREGNANCY: hCG, Beta Chain, Quant, S: 32890 m[IU]/mL — ABNORMAL HIGH (ref <5)

## 2022-12-17 MED ORDER — MISOPROSTOL 200 MCG PO TABS: ORAL_TABLET | ORAL | 1 refills | Status: DC

## 2022-12-17 MED ORDER — ACETAMINOPHEN-CODEINE 300-30 MG PO TABS: 1.0000 | ORAL_TABLET | Freq: Four times a day (QID) | ORAL | 0 refills | Status: DC | PRN

## 2022-12-17 MED ORDER — LOPERAMIDE HCL 2 MG PO TABS: 2.0000 mg | ORAL_TABLET | Freq: Four times a day (QID) | ORAL | 0 refills | Status: DC | PRN

## 2022-12-17 MED ORDER — PROMETHAZINE HCL 12.5 MG PO TABS: 12.5000 mg | ORAL_TABLET | Freq: Four times a day (QID) | ORAL | 0 refills | Status: DC | PRN

## 2022-12-17 NOTE — MAU Note (Addendum)
Gina Hayden is a 42 y.o. at 10wks, here in MAU reporting: talked to the dr's office, was told to come in because she is having spotting and sharp pain in lower abd, bilateral.  Care at [facility], [place] has confirmed IUP. Denies recent intercourse Onset of complaint: this morning Pain score: 5 Vitals:   12/17/22 1229  BP: (!) 140/84  Pulse: 93  Resp: 17  Temp: 98.3 F (36.8 C)  SpO2: 100%     [id number] to hear with doppler Lab orders placed from triage:  UA

## 2022-12-17 NOTE — MAU Provider Note (Signed)
History     CSN: [id number]  Arrival date and time: 12/17/22 1210   Event Date/Time   First Provider Initiated Contact with Patient 12/17/22 1313      Chief Complaint  Patient presents with   Vaginal Bleeding   Abdominal Pain   Gina Hayden , a  42 y.o. [id number] at [redacted]w[redacted]d presents to MAU with complaints of bilateral side pain and dark brown vaginal spotting. Patient states that pain and spotting started this morning when she woke up. She states that the pain is not like cramping. But more like "sharp" and intermittent. She currently rates pain a 6/10 but denies attempting to relieve symptoms. She reports that earlier this morning she had dark brown spotting that went through her underwear, but denies wearing a pad or passing clots. She states that in the last 2 hours its only with wiping. Patient reports that she has a early pregnancy [place] that confirmed a IUP. Reports unknown timeframe on when was completed.            OB History     Gravida  5   Para  2   Term  2   Preterm      AB  2   Living  2      SAB  1   IAB  1   Ectopic      Multiple      Live Births  2           Past Medical History:  Diagnosis Date   Chlamydia    Medical history non-contributory    No pertinent past medical history     Past Surgical History:  Procedure Laterality Date   DILATION AND CURETTAGE OF UTERUS     abortion   WISDOM TOOTH EXTRACTION      Family History  Problem Relation Age of Onset   Hypertension Mother    Hyperlipidemia Mother    Healthy Father    Hypertension Sister    Hyperlipidemia Sister    Stroke Maternal Grandmother    Diabetes Mellitus I Paternal Grandmother    Diabetes Paternal Grandmother    BRCA 1/2 Neg Hx    Breast cancer Neg Hx     Social History   Tobacco Use   Smoking status: Never   Smokeless tobacco: Never  Vaping Use   Vaping Use: Never used  Substance Use Topics   Alcohol use: Not Currently    Comment: Socially   Drug  use: No    Allergies: No Known Allergies  Medications Prior to Admission  Medication Sig Dispense Refill Last Dose   Prenatal Vit-Fe Fum-FA-Omega (ONE-A-DAY WOMENS PRENATAL) 28-0.8 & 223 MG MISC Take 1 tablet by mouth daily. 30 each 12 12/16/2022   acetaminophen (TYLENOL) 325 MG tablet Take 650 mg by mouth every 6 (six) hours as needed.      Calcium Carbonate-Vit D-Min (CALCIUM 1200 PO) Take by mouth.      doxycycline (DORYX) 100 MG EC tablet Take 1 tablet (100 mg total) by mouth 2 (two) times daily. 14 tablet 0    Doxylamine-Pyridoxine 10-10 MG TBEC Take 1 tablet by mouth as directed. Take 2 tablets at bedtime. May add 1 tablet each morning starting on Day 3 if symptoms persist. May add additional tablet at lunchtime on Day 4 if symptoms persist. (Patient not taking: Reported on 09/23/2018) 60 tablet 3    Multiple Vitamin (MULTIVITAMIN) tablet Take 1 tablet by mouth daily.  naproxen (NAPROSYN) 375 MG tablet Take 1 tablet (375 mg total) by mouth 2 (two) times daily. 20 tablet 0    Prenat-FePoly-Metf-FA-DHA-DSS (VITAFOL FE+) 90-1-200 & 50 MG CPPK Take 3 tablets by mouth daily. 90 each 12     Review of Systems  Constitutional:  Negative for chills, fatigue and fever.  Eyes:  Negative for pain and visual disturbance.  Respiratory:  Negative for apnea, shortness of breath and wheezing.   Cardiovascular:  Negative for chest pain and palpitations.  Gastrointestinal:  Negative for abdominal pain, constipation, diarrhea, nausea and vomiting.  Genitourinary:  Positive for pelvic pain, vaginal bleeding and vaginal discharge. Negative for difficulty urinating, dysuria and vaginal pain.  Musculoskeletal:  Negative for back pain.  Neurological:  Negative for seizures, weakness and headaches.  Psychiatric/Behavioral:  Negative for suicidal ideas.    Heart tones attempted in triage but unsuccessful  Physical Exam   Blood pressure (!) 140/84, pulse 93, temperature 98.3 F (36.8 C), temperature  source Oral, resp. rate 17, height 5\' 1"  (1.549 m), weight 73.5 kg, last menstrual period 10/08/2022, SpO2 100 %.  Physical Exam Vitals and nursing note reviewed.  Constitutional:      General: She is not in acute distress.    Appearance: Normal appearance.  HENT:     Head: Normocephalic.  Pulmonary:     Effort: Pulmonary effort is normal.  Genitourinary:    Vagina: No vaginal discharge.  Musculoskeletal:     Cervical back: Normal range of motion.  Skin:    General: Skin is warm and dry.  Neurological:     Mental Status: She is alert and oriented to person, place, and time.  Psychiatric:        Mood and Affect: Mood normal.     MAU Course  Procedures Orders Placed This Encounter  Procedures   Wet prep, genital   US OB LESS THAN 14 WEEKS WITH OB TRANSVAGINAL   Urinalysis, Routine w reflex microscopic -Urine, Clean Catch   CBC   hCG, quantitative, pregnancy   Diet NPO time specified   ABO/Rh   Results for orders placed or performed during the hospital encounter of 12/17/22 (from the past 24 hour(s))  Urinalysis, Routine w reflex microscopic -Urine, Clean Catch     Status: Abnormal   Collection Time: 12/17/22 12:53 PM  Result Value Ref Range   Color, Urine YELLOW YELLOW   APPearance HAZY (A) CLEAR   Specific Gravity, Urine 1.024 1.005 - 1.030   pH 6.0 5.0 - 8.0   Glucose, UA NEGATIVE NEGATIVE mg/dL   Hgb urine dipstick SMALL (A) NEGATIVE   Bilirubin Urine NEGATIVE NEGATIVE   Ketones, ur NEGATIVE NEGATIVE mg/dL   Protein, ur NEGATIVE NEGATIVE mg/dL   Nitrite NEGATIVE NEGATIVE   Leukocytes,Ua NEGATIVE NEGATIVE   RBC / HPF 0-5 0 - 5 RBC/hpf   WBC, UA 0-5 0 - 5 WBC/hpf   Bacteria, UA NONE SEEN NONE SEEN   Squamous Epithelial / HPF 6-10 0 - 5 /HPF   Mucus PRESENT   CBC     Status: None   Collection Time: 12/17/22  1:31 PM  Result Value Ref Range   WBC 6.0 4.0 - 10.5 K/uL   RBC 4.30 3.87 - 5.11 MIL/uL   Hemoglobin 12.8 12.0 - 15.0 g/dL   HCT 16.1 09.6 - 04.5 %    MCV 89.8 80.0 - 100.0 fL   MCH 29.8 26.0 - 34.0 pg   MCHC 33.2 30.0 - 36.0 g/dL   RDW  12.2 11.5 - 15.5 %   Platelets 289 150 - 400 K/uL   nRBC 0.0 0.0 - 0.2 %  ABO/Rh     Status: None   Collection Time: 12/17/22  1:31 PM  Result Value Ref Range   ABO/RH(D) A POS    No rh immune globuloin      NOT A RH IMMUNE GLOBULIN CANDIDATE, PT RH POSITIVE Performed at [facility], [address], [place], [place] [address]   hCG, quantitative, pregnancy     Status: Abnormal   Collection Time: 12/17/22  1:31 PM  Result Value Ref Range   hCG, Beta Chain, Quant, S 32,890 (H) <5 mIU/mL  Wet prep, genital     Status: None   Collection Time: 12/17/22  1:46 PM  Result Value Ref Range   Yeast Wet Prep HPF POC NONE SEEN NONE SEEN   Trich, Wet Prep NONE SEEN NONE SEEN   Clue Cells Wet Prep HPF POC NONE SEEN NONE SEEN   WBC, Wet Prep HPF POC <10 <10   Sperm NONE SEEN    US OB Comp Less 14 Wks  Result Date: 12/17/2022 CLINICAL DATA:  Vaginal bleeding, abdominal pain EXAM: OBSTETRIC <14 WK ULTRASOUND TECHNIQUE: Transabdominal ultrasound was performed for evaluation of the gestation as well as the maternal uterus and adnexal regions. COMPARISON:  None Available. FINDINGS: Intrauterine gestational sac: Single Yolk sac:  Seen Embryo:  Seen Cardiac Activity: Not seen CRL:   5.6 mm   6 w 2 d                  [place] EDC: 08/10/2023 Subchorionic hemorrhage: There is a 1.4 x 0.9 cm hypoechoic structure adjacent to the inferior margin of the gestational sac. Maternal uterus/adnexae: Right ovary is unremarkable. Left ovary is not sonographically visualized. There is no free fluid in pelvis. IMPRESSION: There is a gestational sac containing yolk sac and fetal pole within the uterus. There is no demonstrable fetal cardiac activity. Findings suggest possible failed gestation with incomplete abortion. Serial HCG estimations and follow-up sonogram in 1-2 weeks may be considered. Electronically Signed   By: Ernie Avena M.D.   On: 12/17/2022 14:29    MDM - CNM found a early pregnancy [place] from [facility] on 10/21/22 confirming a IUP with GS, YS and fetal pole measuring [redacted]w[redacted]d. Cardiac activity of 116 bmp   - UA hazy and small amount of blood noted in urine. Otherwise normal. Low suspicion for UTI  - CBC normal . Patient hemodynamically stable.  - Quant 32, 891  - Wet prep normal  - [place] results today revealed a gestational sac, YS and fetal pole without cardiac activity. Indicative for failed pregnancy and likely the start of a miscarriage.  - Plan for discharge    Assessment and Plan   1. Incomplete miscarriage   2. Vaginal bleeding   3. Abdominal pain affecting pregnancy   4. [redacted] weeks gestation of pregnancy    - Reviewed that this is likely the start of a miscarriage. Patient appropriately tearful.  Discussed next options of 1. Watchful Waiting, 2. Cytotec, vs 3. Surgical management. Reviewed risks and benefits of each option. Patient decided to proceed with cytotec  - Rx for Cytotec, Tylenol #3, phenergan and imodium sent to outpatient pharmacy for pickup. - Admin instructions provided at patient bedside.  - Bleeding precautions and expectations reviewed.  - Recommended SAB follow up with [facility] in 2-3 weeks. Message sent to office for patient to  be scheduled.  - Patient discharged home in stable condition and may return to MAU as needed.   Claudette Head, MSN CNM  12/17/2022, 1:13 PM

## 2022-12-18 ENCOUNTER — Telehealth: Payer: Self-pay

## 2022-12-18 LAB — GC/CHLAMYDIA PROBE AMP (~~LOC~~) NOT AT ARMC
Chlamydia: NEGATIVE
Comment: NEGATIVE
Comment: NORMAL
Neisseria Gonorrhea: NEGATIVE

## 2022-12-18 MED ORDER — OXYCODONE-ACETAMINOPHEN 5-325 MG PO TABS: 1.0000 | ORAL_TABLET | ORAL | 0 refills | Status: DC | PRN

## 2022-12-18 NOTE — Telephone Encounter (Signed)
Patient called stating pain medication is on back order and needs new RX for pain medication.   Limited RX for percocet sent to pharmacy.   Rolm Bookbinder, CNM 12/18/22 8:55 AM

## 2023-01-01 ENCOUNTER — Ambulatory Visit (INDEPENDENT_AMBULATORY_CARE_PROVIDER_SITE_OTHER): Payer: BC Managed Care – PPO | Admitting: Obstetrics and Gynecology

## 2023-01-01 VITALS — BP 131/85 | HR 102 | Wt 160.0 lb

## 2023-01-01 DIAGNOSIS — O039 Complete or unspecified spontaneous abortion without complication: Secondary | ICD-10-CM

## 2023-01-01 DIAGNOSIS — I10 Essential (primary) hypertension: Secondary | ICD-10-CM | POA: Diagnosis not present

## 2023-01-01 NOTE — Progress Notes (Signed)
Pt is in the office to follow up after SAB Pt states that she took cytotec after MAU visit on 12/17/22 and had bleeding for about a week, occasional spotting after that. Pt denies any pain today.

## 2023-01-02 ENCOUNTER — Encounter: Payer: Self-pay | Admitting: Obstetrics and Gynecology

## 2023-01-02 LAB — BETA HCG QUANT (REF LAB): hCG Quant: 51 m[IU]/mL

## 2023-01-03 ENCOUNTER — Encounter: Payer: Self-pay | Admitting: Obstetrics and Gynecology

## 2023-01-03 NOTE — Progress Notes (Signed)
  GYNECOLOGY PROGRESS NOTE  History:  [name] is a 42 y.o. [id number] presents to [facility] office today for follow-up gyn visit after a SAB. She reports she had heavy VB x 1 week and 2 days, then she had spotting, she only had spotting once today.  She denies h/a, dizziness, shortness of breath, n/v, or fever/chills.    The following portions of the patient's history were reviewed and updated as appropriate: allergies, current medications, past family history, past medical history, past social history, past surgical history and problem list. Last pap smear on 08/26/2018 was normal.  Review of Systems:  Pertinent items are noted in HPI.   Objective:  Physical Exam Blood pressure 131/85, pulse (!) 102, weight 160 lb (72.6 kg), last menstrual period 10/08/2022, unknown if currently breastfeeding. VS reviewed, nursing note reviewed,  Constitutional: well developed, well nourished, no distress HEENT: normocephalic CV: normal rate Pulm/chest wall: normal effort Breast Exam: deferred Abdomen: soft Neuro: alert and oriented x 3 Skin: warm, dry Psych: affect normal Pelvic exam: deferred Assessment & Plan:  1. Spontaneous miscarriage - Beta hCG quant (ref lab) - Advised that HCG levels will need to be drawn weekly until they reach a level of zero, so it is likely she will have to return next week for another blood draw. I will send her a MyChart message with the POC. - Discussed BC options - patient states, "we're not even having sex like that right now, so I don't need anything at this time."  2. Chronic hypertension - Discussed with patient that as far as back as 2013 her BP has been elevated - Offered PCP referral - Patient states she has a PCP, but she can't remember the name  Total face-to-face time spent during this encounter was 10 minutes. There was 5 minutes of chart review time spent prior to this encounter. Total time spent = 15 minutes.   [name], CNM 3:00  PM

## 2023-01-08 ENCOUNTER — Other Ambulatory Visit: Payer: BC Managed Care – PPO

## 2023-01-08 DIAGNOSIS — O039 Complete or unspecified spontaneous abortion without complication: Secondary | ICD-10-CM

## 2023-01-09 LAB — BETA HCG QUANT (REF LAB): hCG Quant: 9 m[IU]/mL

## 2023-01-19 ENCOUNTER — Other Ambulatory Visit (INDEPENDENT_AMBULATORY_CARE_PROVIDER_SITE_OTHER): Payer: Medicaid Other

## 2023-01-19 DIAGNOSIS — O039 Complete or unspecified spontaneous abortion without complication: Secondary | ICD-10-CM

## 2023-01-19 DIAGNOSIS — Z3A01 Less than 8 weeks gestation of pregnancy: Secondary | ICD-10-CM

## 2023-01-19 NOTE — Progress Notes (Signed)
Pt here today for BHCG only after her SAB.  She states that she has alsmost stopped bleeding.  She voices no other complaints today.

## 2023-01-20 LAB — BETA HCG QUANT (REF LAB): hCG Quant: 2 m[IU]/mL

## 2023-01-21 ENCOUNTER — Encounter: Payer: BC Managed Care – PPO | Admitting: Student

## 2023-05-11 DIAGNOSIS — Z113 Encounter for screening for infections with a predominantly sexual mode of transmission: Secondary | ICD-10-CM | POA: Diagnosis not present

## 2023-05-11 DIAGNOSIS — R309 Painful micturition, unspecified: Secondary | ICD-10-CM | POA: Diagnosis not present

## 2023-05-11 DIAGNOSIS — R3915 Urgency of urination: Secondary | ICD-10-CM | POA: Diagnosis not present

## 2023-05-11 DIAGNOSIS — R350 Frequency of micturition: Secondary | ICD-10-CM | POA: Diagnosis not present

## 2023-06-01 DIAGNOSIS — Z308 Encounter for other contraceptive management: Secondary | ICD-10-CM | POA: Diagnosis not present

## 2023-06-09 ENCOUNTER — Other Ambulatory Visit: Payer: Self-pay | Admitting: Medical Genetics

## 2023-06-09 DIAGNOSIS — Z006 Encounter for examination for normal comparison and control in clinical research program: Secondary | ICD-10-CM

## 2023-07-08 ENCOUNTER — Other Ambulatory Visit (HOSPITAL_COMMUNITY): Payer: Medicaid Other | Attending: Medical Genetics

## 2023-09-29 DIAGNOSIS — Z30011 Encounter for initial prescription of contraceptive pills: Secondary | ICD-10-CM | POA: Diagnosis not present

## 2024-01-03 NOTE — Progress Notes (Signed)
  [facility] Visit Note    Gina Hayden is a 43 y.o. female who presents with medication renewal.   History obtained from patient.   Assessment & Plan:    Assessment & Plan Primary hypertension Hypertension is improving with treatment. Dietary sodium restriction. Weight loss. Regular aerobic exercise. Ambulatory blood pressure monitoring. Blood pressure will be reassessed in 4 weeks.  Orders: .  amLODIPine  (NORVASC ) 5 MG tablet; Take 1 tablet (5 mg total) by mouth daily for 90 days .  losartan  (COZAAR ) 25 MG tablet; Take 1 tablet (25 mg total) by mouth daily for 90 days    Return if symptoms worsen or fail to improve.  Subjective     One-Time Med Renewal Has the patient seen the prescribing clinician within the last 12 months?  If not, please comment.:  Yes Do you have an appointment with the prescribing provider?: Yes   Is this a subsequent refill from [facility]?: Yes   Please document the reason for medication use:  HTN Are you having any medication side effects?:  None    No Known Allergies  Review of Systems  All other systems reviewed and are negative.    Objective     Vital Signs: BP 124/83 (Site: Right arm, Position: Sitting, Cuff Size: Adult)   Pulse 97   Temp 98.1 F (36.7 C) (Tympanic)   Resp 20   Ht 5' 1 (1.549 m)   Wt 159 lb (72.1 kg)   LMP 12/30/2023   SpO2 100%   BMI 30.04 kg/m    Physical Exam Vitals reviewed.  Constitutional:      General: She is not in acute distress.    Appearance: She is well-developed. She is not ill-appearing, toxic-appearing or diaphoretic.  HENT:     Head: Normocephalic and atraumatic.  Eyes:     Conjunctiva/sclera: Conjunctivae normal.  Cardiovascular:     Rate and Rhythm: Normal rate and regular rhythm.     Heart sounds: Normal heart sounds. Heart sounds not distant. No murmur heard.    No friction rub. No gallop.  Pulmonary:     Effort: Pulmonary effort is normal. No accessory muscle usage or  respiratory distress.     Breath sounds: Normal breath sounds. No decreased breath sounds, wheezing, rhonchi or rales.  Musculoskeletal:        General: Normal range of motion.     Cervical back: Normal range of motion.  Skin:    General: Skin is warm and dry.  Neurological:     Mental Status: She is alert and oriented to person, place, and time.     GCS: GCS eye subscore is 4. GCS verbal subscore is 5. GCS motor subscore is 6.  Psychiatric:        Speech: Speech normal.        Behavior: Behavior normal.        Thought Content: Thought content normal.        Judgment: Judgment normal.

## 2024-02-17 DIAGNOSIS — Z114 Encounter for screening for human immunodeficiency virus [HIV]: Secondary | ICD-10-CM | POA: Diagnosis not present

## 2024-02-17 DIAGNOSIS — Z113 Encounter for screening for infections with a predominantly sexual mode of transmission: Secondary | ICD-10-CM | POA: Diagnosis not present

## 2024-02-17 DIAGNOSIS — B3731 Acute candidiasis of vulva and vagina: Secondary | ICD-10-CM | POA: Diagnosis not present

## 2024-03-30 NOTE — Progress Notes (Signed)
  [facility] Visit Note    Gina Hayden is a 43 y.o. female who presents for a BP medication renewal.   History obtained from patient.   Assessment & Plan:    Assessment & Plan Encounter for medication refill     Primary hypertension Hypertension is improving with treatment. Weight loss. Blood pressure will be reassessed in 3 months.  Orders: .  losartan  (COZAAR ) 25 MG tablet; Take 1 tablet (25 mg total) by mouth daily for 90 days .  amLODIPine  (NORVASC ) 5 MG tablet; Take 1 tablet (5 mg total) by mouth daily for 90 days    No follow-ups on file.  Subjective     One-Time Med Renewal Has the patient seen the prescribing clinician within the last 12 months?  If not, please comment.:  Yes Do you have an appointment with the prescribing provider?: Yes   Is this a subsequent refill from [facility]?: Yes   Please document the reason for medication use:  HTN  Are you having any medication side effects?:  No    Allergies[1]  Review of Systems   Objective     Vital Signs: BP 124/82   Pulse 82   Temp 98.9 F (37.2 C) (Oral)   Resp 18   Ht 5' 1 (1.549 m)   Wt 161 lb (73 kg)   LMP 03/21/2024 (Approximate)   SpO2 98%   BMI 30.42 kg/m    Physical Exam Vitals reviewed.  Constitutional:      General: She is awake.     Appearance: She is obese.  HENT:     Head: Normocephalic and atraumatic.  Cardiovascular:     Rate and Rhythm: Normal rate and regular rhythm.     Heart sounds: Normal heart sounds, S1 normal and S2 normal. Heart sounds not distant. No murmur heard.    No friction rub. No gallop.  Pulmonary:     Effort: Pulmonary effort is normal. No respiratory distress.     Breath sounds: Normal breath sounds and air entry. No stridor, decreased air movement or transmitted upper airway sounds. No decreased breath sounds, wheezing, rhonchi or rales.  Chest:     Chest wall: No tenderness.  Musculoskeletal:        General: Normal range of motion.  Skin:     General: Skin is warm and dry.  Neurological:     General: No focal deficit present.     Mental Status: She is alert and oriented to person, place, and time.     GCS: GCS eye subscore is 4. GCS verbal subscore is 5. GCS motor subscore is 6.  Psychiatric:        Attention and Perception: Attention and perception normal.        Mood and Affect: Mood and affect normal.        Speech: Speech normal.        Behavior: Behavior normal. Behavior is cooperative.        Thought Content: Thought content normal.        Cognition and Memory: Cognition and memory normal.                         [1] No Known Allergies

## 2024-04-14 ENCOUNTER — Other Ambulatory Visit: Payer: Self-pay

## 2024-04-14 ENCOUNTER — Emergency Department (HOSPITAL_COMMUNITY)

## 2024-04-14 ENCOUNTER — Emergency Department (HOSPITAL_COMMUNITY)
Admission: EM | Admit: 2024-04-14 | Discharge: 2024-04-14 | Disposition: A | Discharge status: Home / Self Care | Attending: Emergency Medicine | Admitting: Emergency Medicine

## 2024-04-14 DIAGNOSIS — R0789 Other chest pain: Secondary | ICD-10-CM | POA: Insufficient documentation

## 2024-04-14 DIAGNOSIS — R072 Precordial pain: Secondary | ICD-10-CM | POA: Diagnosis not present

## 2024-04-14 DIAGNOSIS — R0602 Shortness of breath: Secondary | ICD-10-CM | POA: Diagnosis not present

## 2024-04-14 DIAGNOSIS — R079 Chest pain, unspecified: Secondary | ICD-10-CM

## 2024-04-14 LAB — BASIC METABOLIC PANEL WITH GFR
Anion gap: 13 (ref 5–15)
BUN: 10 mg/dL (ref 6–20)
CO2: 23 mmol/L (ref 22–32)
Calcium: 9.8 mg/dL (ref 8.9–10.3)
Chloride: 104 mmol/L (ref 98–111)
Creatinine, Ser: 0.76 mg/dL (ref 0.44–1.00)
GFR, Estimated: >60 mL/min (ref >60)
Glucose, Bld: 85 mg/dL (ref 70–99)
Potassium: 4.2 mmol/L (ref 3.5–5.1)
Sodium: 141 mmol/L (ref 135–145)

## 2024-04-14 LAB — CBC WITH DIFFERENTIAL/PLATELET
Abs Immature Granulocytes: 0.01 K/uL (ref 0.00–0.07)
Basophils Absolute: 0 K/uL (ref 0.0–0.1)
Basophils Relative: 1 %
Eosinophils Absolute: 0.1 K/uL (ref 0.0–0.5)
Eosinophils Relative: 2 %
HCT: 44.5 % (ref 36.0–46.0)
Hemoglobin: 14.2 g/dL (ref 12.0–15.0)
Immature Granulocytes: 0 %
Lymphocytes Relative: 29 %
Lymphs Abs: 1.6 K/uL (ref 0.7–4.0)
MCH: 28.1 pg (ref 26.0–34.0)
MCHC: 31.9 g/dL (ref 30.0–36.0)
MCV: 87.9 fL (ref 80.0–100.0)
Monocytes Absolute: 0.3 K/uL (ref 0.1–1.0)
Monocytes Relative: 5 %
Neutro Abs: 3.4 K/uL (ref 1.7–7.7)
Neutrophils Relative %: 63 %
Platelets: 355 K/uL (ref 150–400)
RBC: 5.06 MIL/uL (ref 3.87–5.11)
RDW: 12.1 % (ref 11.5–15.5)
WBC: 5.4 K/uL (ref 4.0–10.5)
nRBC: 0 % (ref 0.0–0.2)

## 2024-04-14 LAB — HEPATIC FUNCTION PANEL
ALT: 17 U/L (ref 0–44)
AST: 22 U/L (ref 15–41)
Albumin: 4.9 g/dL (ref 3.5–5.0)
Alkaline Phosphatase: 51 U/L (ref 38–126)
Bilirubin, Direct: 0.1 mg/dL (ref 0.0–0.2)
Indirect Bilirubin: 0.3 mg/dL (ref 0.3–0.9)
Total Bilirubin: 0.5 mg/dL (ref 0.0–1.2)
Total Protein: 8.5 g/dL — ABNORMAL HIGH (ref 6.5–8.1)

## 2024-04-14 LAB — HCG, SERUM, QUALITATIVE: Preg, Serum: NEGATIVE

## 2024-04-14 LAB — LIPASE, BLOOD: Lipase: 27 U/L (ref 11–51)

## 2024-04-14 LAB — TROPONIN T, HIGH SENSITIVITY: Troponin T High Sensitivity: <15 ng/L (ref 0–19)

## 2024-04-14 MED ORDER — FAMOTIDINE 20 MG PO TABS
40.0000 mg | ORAL_TABLET | Freq: Once | ORAL | Status: AC
  Administered 2024-04-14: 40 mg via ORAL
  Filled 2024-04-14: qty 2

## 2024-04-14 MED ORDER — ALUM & MAG HYDROXIDE-SIMETH 200-200-20 MG/5ML PO SUSP
30.0000 mL | Freq: Once | ORAL | Status: AC
  Administered 2024-04-14: 30 mL via ORAL
  Filled 2024-04-14: qty 30

## 2024-04-14 MED ORDER — PANTOPRAZOLE SODIUM 20 MG PO TBEC: 20.0000 mg | DELAYED_RELEASE_TABLET | Freq: Every day | ORAL | 0 refills | Status: AC

## 2024-04-14 MED ORDER — AMLODIPINE BESYLATE 5 MG PO TABS
5.0000 mg | ORAL_TABLET | Freq: Once | ORAL | Status: AC
  Administered 2024-04-14: 5 mg via ORAL
  Filled 2024-04-14: qty 1

## 2024-04-14 MED ORDER — LOSARTAN POTASSIUM 25 MG PO TABS
25.0000 mg | ORAL_TABLET | Freq: Once | ORAL | Status: AC
  Administered 2024-04-14: 25 mg via ORAL
  Filled 2024-04-14: qty 1

## 2024-04-14 NOTE — ED Notes (Signed)
 Urine collected and sent to lab.

## 2024-04-14 NOTE — ED Provider Triage Note (Signed)
 Emergency Medicine Provider Triage Evaluation Note  Gina Hayden , a 43 y.o. female  was evaluated in triage.  Pt complains of chest pain.  Referred here from her PCP office no prior history of MI.  Not reproducible with palpation.  Not pleuritic.  Review of Systems  Positive: As above Negative: As above  Physical Exam  BP (!) 149/104 (BP Location: Right Arm)   Pulse (!) 112   Temp 98.5 F (36.9 C) (Oral)   Resp 16   SpO2 100%  Gen:   Awake, no distress   Resp:  Normal effort  MSK:   Moves extremities without difficulty  Other:    Medical Decision Making  Medically screening exam initiated at 11:22 AM.  Appropriate orders placed.  LATEEFAH MALLERY was informed that the remainder of the evaluation will be completed by another provider, this initial triage assessment does not replace that evaluation, and the importance of remaining in the ED until their evaluation is complete.    Hildegard Loge, PA-C 04/14/24 1123

## 2024-04-14 NOTE — ED Provider Notes (Signed)
 [place] EMERGENCY DEPARTMENT AT [facility] Provider Note   CSN: [id number] Arrival date & time: 04/14/24  1106     Patient presents with: Chest Pain   Gina Hayden is a 43 y.o. female.   HPI Reports she has had some central chest discomfort for 3 days.  Patient reports that 2 days ago it started as just a vague pressure in the center of her chest.  There were no associated symptoms.  No radiation to the back or the arms.  No shortness of breath, no cough no fever no chills.  No abdominal pain no nausea no vomiting.  No lower extremity swelling or calf pain.  Patient reports today when she awakened she continued to have the pressure sensation in the center of her chest but she also started to feel slightly short of breath.  Patient denies she is experiencing any exertional shortness of breath.  Patient tried aspirin at home with no relief.  Patient was seen at PCP office and referred to the emergency department for further evaluation.    Prior to Admission medications   Medication Sig Start Date End Date Taking? Authorizing Provider  pantoprazole  (PROTONIX ) 20 MG tablet Take 1 tablet (20 mg total) by mouth daily. 04/14/24  Yes Armenta Canning, MD    Allergies: Patient has no known allergies.    Review of Systems  Updated Vital Signs BP (!) 137/93 (BP Location: Right Arm)   Pulse 91   Temp 98 F (36.7 C) (Oral)   Resp 16   LMP 04/10/2024 (Exact Date)   SpO2 100%   Physical Exam Constitutional:      Comments: Alert nontoxic well-nourished well-developed.  Clinically well in appearance.  HENT:     Mouth/Throat:     Pharynx: Oropharynx is clear.  Eyes:     Extraocular Movements: Extraocular movements intact.  Cardiovascular:     Rate and Rhythm: Normal rate and regular rhythm.     Pulses: Normal pulses.     Heart sounds: Normal heart sounds.  Pulmonary:     Effort: Pulmonary effort is normal.     Breath sounds: Normal breath sounds.  Chest:     Chest  wall: No tenderness.  Abdominal:     General: There is no distension.     Palpations: Abdomen is soft.     Tenderness: There is no abdominal tenderness. There is no guarding.  Musculoskeletal:        General: No swelling or tenderness. Normal range of motion.     Right lower leg: No edema.     Left lower leg: No edema.  Skin:    General: Skin is warm and dry.  Neurological:     General: No focal deficit present.     Mental Status: She is oriented to person, place, and time.     Motor: No weakness.     Coordination: Coordination normal.  Psychiatric:        Mood and Affect: Mood normal.     (all labs ordered are listed, but only abnormal results are displayed) Labs Reviewed  HEPATIC FUNCTION PANEL - Abnormal; Notable for the following components:      Result Value   Total Protein 8.5 (*)    All other components within normal limits  CBC WITH DIFFERENTIAL/PLATELET  BASIC METABOLIC PANEL WITH GFR  LIPASE, BLOOD  HCG, SERUM, QUALITATIVE  TROPONIN T, HIGH SENSITIVITY  TROPONIN T, HIGH SENSITIVITY    EKG: EKG Interpretation Date/Time:  Thursday April 14 2024 11:15:05 EDT Ventricular Rate:  111 PR Interval:  137 QRS Duration:  80 QT Interval:  415 QTC Calculation: 559 R Axis:   93  Text Interpretation: Sinus tachycardia Borderline right axis deviation Borderline T abnormalities, diffuse leads no sig change from previous Confirmed by Armenta Canning [phone]) on 04/14/2024 11:40:28 AM  Radiology: [facility] Chest 2 View Result Date: 04/14/2024 CLINICAL DATA:  Midsternal chest pain 3 days with shortness of breath. EXAM: CHEST - 2 VIEW COMPARISON:  10/18/2015 FINDINGS: Lungs are adequately inflated and otherwise clear. Cardiomediastinal silhouette and remainder of the exam is unchanged. IMPRESSION: No active cardiopulmonary disease. Electronically Signed   By: Toribio Agreste M.D.   On: 04/14/2024 11:52     Procedures   Medications Ordered in the ED  famotidine  (PEPCID ) tablet 40  mg (40 mg Oral Given 04/14/24 1241)  alum & mag hydroxide-simeth (MAALOX/MYLANTA) 200-200-20 MG/5ML suspension 30 mL (30 mLs Oral Given 04/14/24 1241)  amLODipine  (NORVASC ) tablet 5 mg (5 mg Oral Given 04/14/24 1242)  losartan  (COZAAR ) tablet 25 mg (25 mg Oral Given 04/14/24 1242)                                    Medical Decision Making Amount and/or Complexity of Data Reviewed Labs: ordered.  Risk OTC drugs. Prescription drug management.   Patient is clinically well in appearance.  Medical history is positive for hypertension, no prior cardiac history.  Physical exam is normal heart sounds and normal pulmonary auscultation.  Will proceed with ACS workup.  Lower suspicion for PE.  Patient does not have pleurisy, chest pain is central and pressure quality with no lower extremity swelling or calf pain.  No PE risk factors.  Possible GI etiology.  Reflux or gastritis considered.  Will trial Pepcid  and Maalox while awaiting remainder of diagnostic results.  EKG reviewed by myself no acute ischemic appearance.  No significant change from previous tracing.  2 view chest x-ray reviewed by radiology and personally reviewed by myself no acute findings.  Troponin negative.  CBC normal.  Basic metabolic panel normal.  Lipase normal.  Hepatic function normal.  hCG negative.  Patient reports she did get improvement after GI cocktail and Pepcid .  She reports discomfort has lessened.  This time with negative diagnostic workup and low risk factor profile, I do feel patient stable for discharge to continue outpatient workup.  I have recommended follow-up with PCP.  We have reviewed strict return precautions.  Patient will trial treatment for GERD.     Final diagnoses:  Nonspecific chest pain    ED Discharge Orders          Ordered    pantoprazole  (PROTONIX ) 20 MG tablet  Daily        04/14/24 1614               Armenta Canning, MD 04/14/24 1617

## 2024-04-14 NOTE — ED Triage Notes (Signed)
 Pt ambulatory to triage with complaints of mid sternal non radiating chest pain that began 3 days ago, and shortness of breat that began today. Pt endorses a hx of HTN, and denies missing any doses of meds.

## 2024-04-14 NOTE — Discharge Instructions (Addendum)
 1.  At this time your heart labs do not show signs of a heart attack.  Your EKG does not show signs of acute abnormality. 2.  Follow-up with your doctor for recheck.  You may need additional outpatient testing. 3.  Try following the dietary instructions for gastroesophageal reflux disease.  Sometimes people experience central chest discomfort and pain from reflux.  Take Protonix  for 2 weeks as prescribed. 4.  Return to emergency department immediately if you feel worsening shortness of breath, suddenly worsening chest pain, lightheadedness or other concerning changes.

## 2024-05-03 ENCOUNTER — Other Ambulatory Visit: Payer: Self-pay | Admitting: Medical Genetics

## 2024-05-03 DIAGNOSIS — Z006 Encounter for examination for normal comparison and control in clinical research program: Secondary | ICD-10-CM
